# Patient Record
Sex: Male | Born: 1950 | Race: White | Hispanic: No | Marital: Married | Smoking: Former smoker
Health system: Southern US, Community
[De-identification: ages and names within clinical notes are randomized; demographics above are authoritative.]

## PROBLEM LIST (undated history)

## (undated) DIAGNOSIS — E119 Type 2 diabetes mellitus without complications: Secondary | ICD-10-CM

## (undated) DIAGNOSIS — I739 Peripheral vascular disease, unspecified: Secondary | ICD-10-CM

## (undated) DIAGNOSIS — I10 Essential (primary) hypertension: Secondary | ICD-10-CM

## (undated) HISTORY — DX: Essential (primary) hypertension: I10

## (undated) HISTORY — DX: Peripheral vascular disease, unspecified: I73.9

## (undated) HISTORY — DX: Type 2 diabetes mellitus without complications: E11.9

---

## 2010-08-21 ENCOUNTER — Other Ambulatory Visit: Payer: Self-pay | Admitting: Neurosurgery

## 2010-08-21 DIAGNOSIS — M4716 Other spondylosis with myelopathy, lumbar region: Secondary | ICD-10-CM

## 2010-08-22 ENCOUNTER — Ambulatory Visit
Admission: RE | Admit: 2010-08-22 | Discharge: 2010-08-22 | Disposition: A | Discharge status: Home / Self Care | Payer: PRIVATE HEALTH INSURANCE | Source: Ambulatory Visit | Attending: Neurosurgery | Admitting: Neurosurgery

## 2010-08-22 DIAGNOSIS — M4716 Other spondylosis with myelopathy, lumbar region: Secondary | ICD-10-CM

## 2010-09-18 ENCOUNTER — Encounter (INDEPENDENT_AMBULATORY_CARE_PROVIDER_SITE_OTHER): Payer: PRIVATE HEALTH INSURANCE

## 2010-09-18 DIAGNOSIS — I70219 Atherosclerosis of native arteries of extremities with intermittent claudication, unspecified extremity: Secondary | ICD-10-CM

## 2010-09-18 NOTE — Procedures (Unsigned)
LOWER EXTREMITY ARTERIAL DUPLEX  INDICATION:  Claudication of the left lower extremity.  HISTORY: Diabetes:  No. Cardiac:  No. Hypertension:  Yes. Smoking:  Currently. Previous Surgery:  No.  SINGLE LEVEL ARTERIAL EXAM                         RIGHT                LEFT Brachial:               141                  142 Anterior tibial:        150                  97 Posterior tibial:       145                  92 Peroneal: Ankle/Brachial Index:   106                  0.68  LOWER EXTREMITY ARTERIAL DUPLEX EXAM  DUPLEX:  Left distal common iliac/proximal external iliac stenosis of greater than 75% with a peak systolic velocity of 505 cm/s.  IMPRESSION: 1. 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 2. Remainder of the left lower extremity appears patent and without     any evidence of hemodynamically significant stenosis. 3. The right ankle brachial index is normal and the left ankle     brachial index is suggestive of moderate disease. 4. Preliminary results faxed to Dr. Trey Sailors on 09/18/2010 at 3:15PM.         ___________________________________________ Di Kindle. Edilia Bo, M.D.  SH/MEDQ  D:  09/18/2010  T:  09/18/2010  Job:  [id number]

## 2010-10-17 ENCOUNTER — Encounter (INDEPENDENT_AMBULATORY_CARE_PROVIDER_SITE_OTHER): Payer: PRIVATE HEALTH INSURANCE | Admitting: Vascular Surgery

## 2010-10-17 DIAGNOSIS — I70219 Atherosclerosis of native arteries of extremities with intermittent claudication, unspecified extremity: Secondary | ICD-10-CM

## 2010-10-17 NOTE — Consult Note (Signed)
NEW PATIENT CONSULTATION  Dennis, Bradley DOB:  06-16-1950                                       10/17/2010 [id number]#:[id number]  Patient presents today for evaluation of left leg claudication.  He is an active 60 year old gentleman who reports progressively severe discomfort in his left leg with walking.  He reports this begins in his buttocks and if he walks further, it can extend into his calf.  It is relieved with rest.  He does not have any rest pain.  This has been present for several months and has become increasingly severe over this time.  He does not have any history of tissue loss.  He reports this is an aching and cramping sensation.  His past history is significant for hypertension.  He has no history of cardiac disease.  No diabetes.  SOCIAL HISTORY:  He is married with 4 children.  He does smoke 1/2 pack of cigarettes per day.  He works as a Naval architect.  He does not drink alcohol.  FAMILY HISTORY:  Significant for premature atherosclerotic disease in his mother.  REVIEW OF SYSTEMS:  No weight loss or gain.  He weighs 255 pounds.  He is 5 feet 11 inches tall.  He does have pain in his feet with walking and lying flat. CARDIAC:  Shortness of breath with exertion. GI:  Black stools, blood in the stools.  Reflux, hiatal hernia, trouble swallowing, and constipation. GU:  Noted for urinary frequency. MUSCULOSKELETAL:  For joint and muscle pain. Review of systems otherwise negative.  PHYSICAL EXAMINATION:  A well-developed and well-nourished white male appearing stated age in no acute stress.  Blood pressure is 125/80, pulse 73, respirations 18.  HEENT:  Normal.  Chest:  Clear bilaterally without rales, rhonchi or wheezes.  His heart is a regular rate and rhythm.  Carotid arteries are normal pulse with no bruits.  He has 2+ radial and 2+ right femoral pulse. He has an absent left femoral pulse and absent distal pulses.  He does have 2+ dorsalis  pedis pulses on the right.  He does have scattered varicose veins over his lower extremities bilaterally.  Musculoskeletal:  No major deformities or cyanosis. Abdomen:  Soft, nontender.  No bruits noted in his lower quadrants. Neurologic:  No focal weakness or paresthesias.  Skin without ulcers or rashes.  He did undergo noninvasive vascular laboratory studies in our office on 09/18/10.  This showed a normal ankle arm index on the right with normal triphasic waveforms and diminished flow on the left foot with an ankle arm index of 0.68 and monophasic waveforms.  I discussed this at length with patient and his family present.  I have recommended arteriography for further evaluation. 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  This would be further evaluation with sonography, and it may be amenable to angioplasty, depending on the arteriograms.  He understands, and we will schedule this procedure for diagnostic and possible intervention as an outpatient at [facility] at his convenience.    Larina Earthly, M.D. Electronically Signed  TFE/MEDQ  D:  10/17/2010  T:  10/17/2010  Job:  [id number]  cc:   Payton Doughty, M.D. [facility]

## 2010-11-09 ENCOUNTER — Ambulatory Visit (HOSPITAL_COMMUNITY)
Admission: RE | Admit: 2010-11-09 | Discharge: 2010-11-09 | Disposition: A | Discharge status: Home / Self Care | Payer: PRIVATE HEALTH INSURANCE | Source: Ambulatory Visit | Attending: Vascular Surgery | Admitting: Vascular Surgery

## 2010-11-09 DIAGNOSIS — I10 Essential (primary) hypertension: Secondary | ICD-10-CM | POA: Insufficient documentation

## 2010-11-09 DIAGNOSIS — I70219 Atherosclerosis of native arteries of extremities with intermittent claudication, unspecified extremity: Secondary | ICD-10-CM | POA: Insufficient documentation

## 2010-11-09 DIAGNOSIS — I708 Atherosclerosis of other arteries: Secondary | ICD-10-CM | POA: Insufficient documentation

## 2010-11-09 HISTORY — PX: OTHER SURGICAL HISTORY: SHX169

## 2010-11-09 LAB — POCT I-STAT, CHEM 8
BUN: 13 mg/dL (ref 6–23)
Potassium: 4.2 mEq/L (ref 3.5–5.1)
Sodium: 138 mEq/L (ref 135–145)
TCO2: 26 mmol/L (ref 0–100)

## 2010-11-09 LAB — POCT ACTIVATED CLOTTING TIME
Activated Clotting Time: 270 seconds
Activated Clotting Time: 217 seconds
Activated Clotting Time: 181 seconds

## 2010-11-09 LAB — GLUCOSE, CAPILLARY: Glucose-Capillary: 227 mg/dL — ABNORMAL HIGH (ref 70–99)

## 2010-11-12 NOTE — Op Note (Signed)
Dennis Bradley, Dennis Bradley NO.:  [id number]  MEDICAL RECORD NO.:  [id number]  LOCATION:  [facility]                         FACILITY:  [facility]  PHYSICIAN:  Fransisco Hertz, MD       DATE OF BIRTH:  1951/01/13  DATE OF PROCEDURE:  11/09/2010 DATE OF DISCHARGE:  11/09/2010                              OPERATIVE REPORT   PROCEDURES: 1. Left common femoral artery cannulation under ultrasound guidance. 2. Aortogram. 3. Left common iliac artery stenting with a 10-mm x 25-mm Express. 4. Bilateral leg runoff.  PREOPERATIVE DIAGNOSIS:  Left iliac occlusive disease.  POSTOPERATIVE DIAGNOSIS:  Left iliac occlusive disease.  SURGEON:  Fransisco Hertz, MD  ANESTHESIA:  Conscious sedation.  ESTIMATED BLOOD LOSS:  Minimal.  CONTRAST:  150 mL.  SPECIMENS:  None.  FINDINGS IN THIS CASE: 1. Patent aorta. 2. Patent bilateral renal arteries. 3. Patent bilateral common iliac artery. 4. Left common iliac artery stenosis greater than 75%, resolved after     stenting. 5. Patent bilateral external iliac artery and external iliac artery. 6. Patent bilateral common femoral artery, profunda femoral arteries,     and superficial femoral arteries. 7. Patent bilateral popliteal and trifurcation. 8. Right posterior tibial, anterior tibial or runoffs to this leg. 9. The left runoff is not evident due to image acquisition.  The left     blood flow was significantly faster than the right lobe.  INDICATIONS:  This is a 60 year old gentleman who presents with left thigh claudication.  It was found on examination in clinic to have absent femoral pulse on the left side.  Subsequently, Dr. Arbie Cookey felt that the patient had a significant iliac occlusion and recommended proceeding forward with angiogram, possible intervention.  The patient is aware of the risks, benefits of this procedure.  He is aware of the risks include but are not limited to bleeding, infection, access site complication, possible  embolization, possible rupture of treated vessels, possible need for emergent procedures, and also possible need for additional surgical procedures.  He is aware of these risks and agreed to proceed forward.  DESCRIPTION OF OPERATION:  After full informed written consent was obtained from the patient, he was brought back to the angio suite, placed supine upon the angio table.  He was connected to monitoring equipment and given conscious sedation, amounts of which are documented in his chart.  He was then prepped and draped in standard fashion for aortogram of bilateral leg runoff.  I turned my attention first to his left groin.  Under ultrasound guidance, I identified the common femoral artery.  There was no extensive pulsation in this common femoral artery. It was cannulated with a micropuncture needle and a microwire passed up into it.  The micro-sheath was then placed over the wire after removing the needle.  A Bentson wire was then passed up into the iliac arterial system; however, it caught midway; however, there was enough wire in the vessel to exchange the sheath out for a short 5-French sheath.  At this point, I did a quick hand injection, this demonstrated a lip in the common iliac artery that was impeding progress of the wire.  After accounting for  this, I was able to eventually maneuver the wire past this lip in the artery and advanced the wire up into the aorta.  The Omniflush catheter was loaded over the wire up to the level of L1.  The wire was removed.  The catheter was connected to the power injector circuit after performing declotting and de-airing maneuver.  Power injector aortogram was completed, the findings of which were listed above and then did oblique shots of the pelvis.  This was delineated the stenosis in the left common iliac artery.  This was greater than 75% and obviously hemodynamically significant as I had difficulty getting the wire up into the aorta.  At  this point, we exchanged out the sheath for a 7-French long sheath over the wire and then completed dedicated hand injection to clearly delineate the position of this stenosis and also obtain measurements.  Based on findings, i felt a 10-mm x 25-mm balloon expandable stent was indicated.  We then placed this over the wire distal to this lesion and then pulled back the sheath to reveal the stent and then pulled back the stent to appropriate location.  This was then deployed at 10 atmospheres of pressure, this fully deployed the stent.  There was an obvious waist in the artery while deploying the stent, which was completely resolution after deployment.  I then deflated the balloon and removed it and then did hand injection, this demonstrated resolution of the previous high-grade left common iliac artery stenosis.  At this point, I replaced the Omniflush catheter up proximal to the bifurcation of the common iliac arteries.  The catheter was reconnected to the power injector circuit, then we set him up for bilateral automated leg runoff and then we completed the automated bilateral leg runoff findings of which are listed above.  At this point, the patient had nearly received 200 mL of contrast.  As we had found the  lesion responsible for this patient's symptomatology, I did not feel  completing bilateral foot films justified the risk of additional contrast.   At this point, I reconstituted the crook of this catheter of a Bentson wire  and then pulled out the catheter and wire.  The sheath was aspirated and flushed with heparinized saline and I pulled the sheath back into the external iliac artery.  Plan in this patient is to pull the catheter in the holding area.  He is also going to be receive 300 mg of Plavix.  COMPLICATIONS:  None.  CONDITION:  Stable.     Fransisco Hertz, MD     BLC/MEDQ  D:  11/09/2010  T:  11/10/2010  Job:  [id number]  Electronically Signed by Leonides Sake MD on  11/12/2010 12:07:56 PM

## 2010-12-05 ENCOUNTER — Ambulatory Visit (INDEPENDENT_AMBULATORY_CARE_PROVIDER_SITE_OTHER): Payer: PRIVATE HEALTH INSURANCE | Admitting: Vascular Surgery

## 2010-12-05 DIAGNOSIS — I70219 Atherosclerosis of native arteries of extremities with intermittent claudication, unspecified extremity: Secondary | ICD-10-CM

## 2010-12-05 NOTE — Assessment & Plan Note (Signed)
OFFICE VISIT  Dennis Bradley, Dennis Bradley DOB:  09-Aug-1950                                       12/05/2010 [id number]#:[id number]  Patient presents today for follow-up of his left iliac stenting in conjunction with aortogram with runoff by Dr. Leonides Sake on 11/09/10.  I reviewed his films with patient.  This did show high-grade stenosis in his common iliac artery with excellent result from angioplasty and stenting.  Fortunately, patient has had complete resolution of his left leg claudication.  His left groin puncture site is well-healed without any evidence of wound issues.  He does have a palpable femoral and 2+ dorsalis pedis pulses bilaterally.  He will continue with his walking program.  He will continue his Plavix treatment and will see [place] again in 3 months with repeat ankle-arm indices and scanning of his angioplasty site.  He will notify us should he develop any difficulty in the interim.    Larina Earthly, M.D. Electronically Signed  TFE/MEDQ  D:  12/05/2010  T:  12/05/2010  Job:  [id number]  cc:   Roma Kayser, PA

## 2011-03-02 ENCOUNTER — Other Ambulatory Visit: Payer: Self-pay | Admitting: Neurosurgery

## 2011-03-02 DIAGNOSIS — M47816 Spondylosis without myelopathy or radiculopathy, lumbar region: Secondary | ICD-10-CM

## 2011-03-07 ENCOUNTER — Other Ambulatory Visit (INDEPENDENT_AMBULATORY_CARE_PROVIDER_SITE_OTHER): Payer: PRIVATE HEALTH INSURANCE | Admitting: *Deleted

## 2011-03-07 ENCOUNTER — Ambulatory Visit (INDEPENDENT_AMBULATORY_CARE_PROVIDER_SITE_OTHER): Payer: PRIVATE HEALTH INSURANCE | Admitting: *Deleted

## 2011-03-07 DIAGNOSIS — Z48812 Encounter for surgical aftercare following surgery on the circulatory system: Secondary | ICD-10-CM

## 2011-03-07 DIAGNOSIS — I739 Peripheral vascular disease, unspecified: Secondary | ICD-10-CM

## 2011-03-13 NOTE — Procedures (Unsigned)
AORTA-ILIAC DUPLEX EVALUATION  INDICATION:  Follow up left CIA stent placed 11/09/10.  HISTORY: Diabetes:  No. Cardiac:  Yes. Hypertension:  Yes. Smoking:  Yes. Previous Surgery:  No.              SINGLE LEVEL ARTERIAL EXAM                             RIGHT                  LEFT Brachial: Anterior tibial: Posterior tibial: Peroneal: Ankle/brachial index: Previous ABI/date:  AORTA-ILIAC DUPLEX EXAM Aorta - Proximal     NV Aorta - Mid          75 cm/s Aorta - Distal       NV  RIGHT                                   LEFT                   CIA-PROXIMAL          NV                   CIA-DISTAL            NV                   HYPOGASTRIC           NV                   EIA-PROXIMAL          NV                   EIA-MID               120 cm/s                   EIA-DISTAL            163 cm/s  IMPRESSION: 1. The left common iliac artery stent could not be visualized due to     bowel gas; however, outflow waveforms are triphasic. 2. Of note, the examination was performed in the afternoon, and the     patient was not n.p.o.  ___________________________________________ Fransisco Hertz, MD  LT/MEDQ  D:  03/07/2011  T:  03/07/2011  Job:  [id number]

## 2011-03-14 ENCOUNTER — Encounter: Payer: Self-pay | Admitting: Vascular Surgery

## 2011-03-15 ENCOUNTER — Ambulatory Visit
Admission: RE | Admit: 2011-03-15 | Discharge: 2011-03-15 | Disposition: A | Discharge status: Home / Self Care | Payer: PRIVATE HEALTH INSURANCE | Source: Ambulatory Visit | Attending: Neurosurgery | Admitting: Neurosurgery

## 2011-03-15 ENCOUNTER — Other Ambulatory Visit: Payer: Self-pay | Admitting: Neurosurgery

## 2011-03-15 DIAGNOSIS — M47816 Spondylosis without myelopathy or radiculopathy, lumbar region: Secondary | ICD-10-CM

## 2011-03-15 MED ORDER — METHYLPREDNISOLONE ACETATE 40 MG/ML INJ SUSP (RADIOLOG
120.0000 mg | Freq: Once | INTRAMUSCULAR | Status: AC
  Administered 2011-03-15: 120 mg via INTRA_ARTICULAR

## 2011-03-15 MED ORDER — IOHEXOL 180 MG/ML  SOLN
1.0000 mL | Freq: Once | INTRAMUSCULAR | Status: AC | PRN
  Administered 2011-03-15: 1 mL via INTRA_ARTICULAR

## 2011-06-12 ENCOUNTER — Other Ambulatory Visit: Payer: PRIVATE HEALTH INSURANCE

## 2011-06-19 ENCOUNTER — Other Ambulatory Visit: Payer: PRIVATE HEALTH INSURANCE

## 2011-06-20 ENCOUNTER — Ambulatory Visit (INDEPENDENT_AMBULATORY_CARE_PROVIDER_SITE_OTHER): Payer: PRIVATE HEALTH INSURANCE | Admitting: *Deleted

## 2011-06-20 ENCOUNTER — Other Ambulatory Visit (INDEPENDENT_AMBULATORY_CARE_PROVIDER_SITE_OTHER): Payer: PRIVATE HEALTH INSURANCE | Admitting: *Deleted

## 2011-06-20 ENCOUNTER — Other Ambulatory Visit: Payer: PRIVATE HEALTH INSURANCE

## 2011-06-20 DIAGNOSIS — I739 Peripheral vascular disease, unspecified: Secondary | ICD-10-CM

## 2011-06-20 DIAGNOSIS — Z48812 Encounter for surgical aftercare following surgery on the circulatory system: Secondary | ICD-10-CM

## 2011-06-28 ENCOUNTER — Encounter: Payer: Self-pay | Admitting: Vascular Surgery

## 2011-06-29 NOTE — Procedures (Unsigned)
AORTA-ILIAC DUPLEX EVALUATION  INDICATION:  Followup left CIA stent.  HISTORY: Diabetes:  Yes Cardiac:  No Hypertension:  Yes Smoking:  Yes Previous Surgery:  Left CIA stent placed 11/09/2010              SINGLE LEVEL ARTERIAL EXAM                             RIGHT                  LEFT Brachial: Anterior tibial: Posterior tibial: Peroneal: Ankle/brachial index:      1.07                   1.21 Previous ABI/date:         03/07/2011, 1.02       03/07/2011, 1.21  AORTA-ILIAC DUPLEX EXAM Aorta - Proximal     106 cm/s Aorta - Mid          84 cm/s Aorta - Distal       77 cm/s  RIGHT                                   LEFT                   CIA-PROXIMAL          137 cm/s (stent)                   CIA-DISTAL            135 cm/s (stent)                   HYPOGASTRIC           NV                   EIA-PROXIMAL          186 cm/s                   EIA-MID               180 cm/s                   EIA-DISTAL            124 cm/s  IMPRESSION:  Widely patent left CIA stent without evidence of restenosis or hyperplasia.  ___________________________________________ Larina Earthly, M.D.  LT/MEDQ  D:  06/20/2011  T:  06/20/2011  Job:  [id number]

## 2011-09-12 ENCOUNTER — Encounter (INDEPENDENT_AMBULATORY_CARE_PROVIDER_SITE_OTHER): Payer: PRIVATE HEALTH INSURANCE | Admitting: *Deleted

## 2011-09-12 ENCOUNTER — Ambulatory Visit (INDEPENDENT_AMBULATORY_CARE_PROVIDER_SITE_OTHER): Payer: PRIVATE HEALTH INSURANCE | Admitting: *Deleted

## 2011-09-12 DIAGNOSIS — I724 Aneurysm of artery of lower extremity: Secondary | ICD-10-CM

## 2011-09-12 DIAGNOSIS — Z48812 Encounter for surgical aftercare following surgery on the circulatory system: Secondary | ICD-10-CM

## 2011-09-12 DIAGNOSIS — I739 Peripheral vascular disease, unspecified: Secondary | ICD-10-CM

## 2011-09-20 ENCOUNTER — Encounter: Payer: Self-pay | Admitting: Vascular Surgery

## 2011-09-20 ENCOUNTER — Other Ambulatory Visit: Payer: Self-pay | Admitting: *Deleted

## 2011-09-20 DIAGNOSIS — Z48812 Encounter for surgical aftercare following surgery on the circulatory system: Secondary | ICD-10-CM

## 2011-09-20 DIAGNOSIS — I739 Peripheral vascular disease, unspecified: Secondary | ICD-10-CM

## 2011-09-20 NOTE — Procedures (Unsigned)
AORTA-ILIAC DUPLEX EVALUATION  INDICATION:  Follow up left CIA stent.  HISTORY: Diabetes:  Yes. Cardiac:  No. Hypertension:  Yes. Smoking:  Yes. Previous Surgery:  Left CIA stent placed 11/09/10.              SINGLE LEVEL ARTERIAL EXAM                             RIGHT                  LEFT Brachial: Anterior tibial: Posterior tibial: Peroneal: Ankle/brachial index:      1.01                   1.15 Previous ABI/date:         06/19/10, 1.07         06/19/10, 1.21  AORTA-ILIAC DUPLEX EXAM Aorta - Proximal     67 cm/s Aorta - Mid          93 cm/s Aorta - Distal       32 cm/s  RIGHT                                   LEFT                   CIA-PROXIMAL          153 cm/s (stent)                   CIA-DISTAL            140 cm/s (stent)                   HYPOGASTRIC                   EIA-PROXIMAL          158 cm/s                   EIA-MID               179 cm/s                   EIA-DISTAL            160 cm/s  IMPRESSION: 1. Patent left common iliac artery stent with mild hyperplasia     observed without focal stenosis. 2. 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  The aortic diameters are within normal limits at     approximately 2.6 cm. 3. No significant change since the previous examination.  ___________________________________________ Larina Earthly, M.D.  LT/MEDQ  D:  09/13/2011  T:  09/13/2011  Job:  [id number]

## 2011-11-09 ENCOUNTER — Other Ambulatory Visit: Payer: Self-pay | Admitting: Vascular Surgery

## 2012-03-12 ENCOUNTER — Encounter: Payer: Self-pay | Admitting: Neurosurgery

## 2012-03-18 ENCOUNTER — Ambulatory Visit: Payer: PRIVATE HEALTH INSURANCE | Admitting: Neurosurgery

## 2012-03-18 ENCOUNTER — Other Ambulatory Visit: Payer: PRIVATE HEALTH INSURANCE

## 2012-03-27 ENCOUNTER — Ambulatory Visit: Payer: PRIVATE HEALTH INSURANCE | Admitting: Neurosurgery

## 2012-03-27 ENCOUNTER — Other Ambulatory Visit: Payer: PRIVATE HEALTH INSURANCE

## 2012-04-21 ENCOUNTER — Ambulatory Visit: Payer: PRIVATE HEALTH INSURANCE | Admitting: Neurosurgery

## 2012-04-21 ENCOUNTER — Other Ambulatory Visit: Payer: PRIVATE HEALTH INSURANCE

## 2012-05-29 ENCOUNTER — Encounter: Payer: Self-pay | Admitting: Neurosurgery

## 2012-05-30 ENCOUNTER — Other Ambulatory Visit (INDEPENDENT_AMBULATORY_CARE_PROVIDER_SITE_OTHER): Payer: PRIVATE HEALTH INSURANCE | Admitting: *Deleted

## 2012-05-30 ENCOUNTER — Ambulatory Visit (INDEPENDENT_AMBULATORY_CARE_PROVIDER_SITE_OTHER): Payer: PRIVATE HEALTH INSURANCE | Admitting: Neurosurgery

## 2012-05-30 ENCOUNTER — Encounter (INDEPENDENT_AMBULATORY_CARE_PROVIDER_SITE_OTHER): Payer: PRIVATE HEALTH INSURANCE | Admitting: *Deleted

## 2012-05-30 ENCOUNTER — Encounter: Payer: Self-pay | Admitting: Neurosurgery

## 2012-05-30 VITALS — BP 128/77 | HR 64 | Ht 71.0 in | Wt 234.7 lb

## 2012-05-30 DIAGNOSIS — Z48812 Encounter for surgical aftercare following surgery on the circulatory system: Secondary | ICD-10-CM

## 2012-05-30 DIAGNOSIS — I771 Stricture of artery: Secondary | ICD-10-CM | POA: Insufficient documentation

## 2012-05-30 DIAGNOSIS — I739 Peripheral vascular disease, unspecified: Secondary | ICD-10-CM

## 2012-05-30 NOTE — Addendum Note (Signed)
Addended by: Sharee Pimple on: 05/30/2012 02:58 PM   Modules accepted: Orders

## 2012-05-30 NOTE — Addendum Note (Signed)
Addended by: Sharee Pimple on: 05/30/2012 02:59 PM   Modules accepted: Orders

## 2012-05-30 NOTE — Progress Notes (Signed)
Subjective:     Patient ID: Dennis Bradley, male   DOB: 1951-02-19, 62 y.o.   MRN: [id number]  HPI: Surveillance status post left common iliac artery stent in June of 2012. The patient reports no claudication or rest pain. The patient has no other medical issues at this time.   Review of Systems: 12 point review of systems is notable for the difficulties described above otherwise unremarkable     Objective:   Physical Exam: Afebrile, vital signs are stable, the patient has palpable lower extremity pulses bilaterally and is well perfused.     Assessment:     Asymptomatic patient with no claudication no back pain or hip pain status post left CIA stent June 2012. 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    Plan:     The patient will followup in one year with repeat ABIs and iliac stent evaluation, his questions were encouraged and answered, he is in agreement with this plan.

## 2012-11-17 ENCOUNTER — Telehealth: Payer: Self-pay

## 2012-11-17 DIAGNOSIS — R200 Anesthesia of skin: Secondary | ICD-10-CM

## 2012-11-17 DIAGNOSIS — I739 Peripheral vascular disease, unspecified: Secondary | ICD-10-CM

## 2012-11-17 NOTE — Telephone Encounter (Signed)
Pt. called to report a 2 week hx. of tingling in left foot.  States that in the last week, has started having pain in left buttock, radiating down to shin, with walking, and getting in and out of truck.  Describes "an aching in his shin."  Also states has discomfort inner left thigh between his genitals and groin.  States his pain is worse today.  Denies any color or temperature change of left leg/foot.  Denies any open sores.

## 2012-11-17 NOTE — Telephone Encounter (Signed)
Spoke with Dr. Myra Gianotti.  Advised that pt. Should be scheduled for left lower art. Duplex and ABI's tomrrow.  Advised pt. To expect a call with an appt. For vascular studies.  Pt. Advised not to eat in the morning, and to only take morning meds with sips of water.  Verb. Understanding.

## 2012-11-19 ENCOUNTER — Encounter (INDEPENDENT_AMBULATORY_CARE_PROVIDER_SITE_OTHER): Payer: PRIVATE HEALTH INSURANCE | Admitting: *Deleted

## 2012-11-19 ENCOUNTER — Other Ambulatory Visit (INDEPENDENT_AMBULATORY_CARE_PROVIDER_SITE_OTHER): Payer: PRIVATE HEALTH INSURANCE | Admitting: *Deleted

## 2012-11-19 DIAGNOSIS — R200 Anesthesia of skin: Secondary | ICD-10-CM

## 2012-11-19 DIAGNOSIS — I739 Peripheral vascular disease, unspecified: Secondary | ICD-10-CM

## 2012-11-19 DIAGNOSIS — Z48811 Encounter for surgical aftercare following surgery on the nervous system: Secondary | ICD-10-CM

## 2012-11-20 ENCOUNTER — Telehealth: Payer: Self-pay | Admitting: Vascular Surgery

## 2012-11-20 NOTE — Telephone Encounter (Addendum)
Message copied by Fredrich Birks on Thu Nov 20, 2012 10:11 AM ------      Message from: Phillips Odor      Created: Thu Nov 20, 2012 10:05 AM      Regarding: needs appt. changed from [facility] to [facility] 05/2013       Pt. Had vascular studies done on 6/25; TFE reviewed.  Next appt. In 05/2013; needs to keep vasc studies and have provider appt. With Dr. Arbie Cookey @ that time.  ------  11/20/12: changed from [facility] to TFE, dpm

## 2012-11-21 ENCOUNTER — Encounter: Payer: Self-pay | Admitting: Vascular Surgery

## 2012-11-21 ENCOUNTER — Other Ambulatory Visit: Payer: Self-pay

## 2012-11-21 DIAGNOSIS — M79609 Pain in unspecified limb: Secondary | ICD-10-CM

## 2012-11-21 DIAGNOSIS — I739 Peripheral vascular disease, unspecified: Secondary | ICD-10-CM

## 2012-12-07 ENCOUNTER — Other Ambulatory Visit: Payer: Self-pay | Admitting: Neurosurgery

## 2012-12-07 DIAGNOSIS — M4716 Other spondylosis with myelopathy, lumbar region: Secondary | ICD-10-CM

## 2012-12-31 ENCOUNTER — Ambulatory Visit
Admission: RE | Admit: 2012-12-31 | Discharge: 2012-12-31 | Disposition: A | Discharge status: Home / Self Care | Payer: 59 | Source: Ambulatory Visit | Attending: Neurosurgery | Admitting: Neurosurgery

## 2012-12-31 VITALS — BP 146/73 | HR 67

## 2012-12-31 DIAGNOSIS — M4716 Other spondylosis with myelopathy, lumbar region: Secondary | ICD-10-CM

## 2012-12-31 MED ORDER — IOHEXOL 180 MG/ML  SOLN
1.0000 mL | Freq: Once | INTRAMUSCULAR | Status: AC | PRN
  Administered 2012-12-31: 1 mL via EPIDURAL

## 2012-12-31 MED ORDER — METHYLPREDNISOLONE ACETATE 40 MG/ML INJ SUSP (RADIOLOG
120.0000 mg | Freq: Once | INTRAMUSCULAR | Status: AC
  Administered 2012-12-31: 120 mg via EPIDURAL

## 2013-06-01 ENCOUNTER — Encounter: Payer: Self-pay | Admitting: Vascular Surgery

## 2013-06-02 ENCOUNTER — Ambulatory Visit (INDEPENDENT_AMBULATORY_CARE_PROVIDER_SITE_OTHER)
Admission: RE | Admit: 2013-06-02 | Discharge: 2013-06-02 | Disposition: A | Discharge status: Home / Self Care | Payer: 59 | Source: Ambulatory Visit | Attending: Neurosurgery | Admitting: Neurosurgery

## 2013-06-02 ENCOUNTER — Encounter: Payer: Self-pay | Admitting: Vascular Surgery

## 2013-06-02 ENCOUNTER — Ambulatory Visit (HOSPITAL_COMMUNITY)
Admission: RE | Admit: 2013-06-02 | Discharge: 2013-06-02 | Disposition: A | Discharge status: Home / Self Care | Payer: 59 | Source: Ambulatory Visit | Attending: Vascular Surgery | Admitting: Vascular Surgery

## 2013-06-02 ENCOUNTER — Ambulatory Visit (INDEPENDENT_AMBULATORY_CARE_PROVIDER_SITE_OTHER): Payer: 59 | Admitting: Vascular Surgery

## 2013-06-02 ENCOUNTER — Other Ambulatory Visit: Payer: PRIVATE HEALTH INSURANCE

## 2013-06-02 ENCOUNTER — Ambulatory Visit: Payer: PRIVATE HEALTH INSURANCE | Admitting: Neurosurgery

## 2013-06-02 VITALS — BP 117/68 | HR 61 | Ht 71.0 in | Wt 235.9 lb

## 2013-06-02 DIAGNOSIS — I739 Peripheral vascular disease, unspecified: Secondary | ICD-10-CM | POA: Insufficient documentation

## 2013-06-02 DIAGNOSIS — I708 Atherosclerosis of other arteries: Secondary | ICD-10-CM | POA: Insufficient documentation

## 2013-06-02 DIAGNOSIS — Z48812 Encounter for surgical aftercare following surgery on the circulatory system: Secondary | ICD-10-CM

## 2013-06-02 DIAGNOSIS — I771 Stricture of artery: Secondary | ICD-10-CM

## 2013-06-02 NOTE — Progress Notes (Signed)
The patient presents today for followup of his left common iliac artery stenting in June of 2012. He has no new major medical difficulties. He has no lower extremity claudication symptoms. 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 This resolves after he walks for a brief. I explained that this is not related to arterial insufficiency and sounds more like nerve impingement. This is quite mild and not limiting to him. He has no lower extremity tissue loss. He has no cardiac difficulties  Past Medical History  Diagnosis Date  . Peripheral vascular disease   . Hypertension     History  Substance Use Topics  . Smoking status: Former Smoker -- 1.50 packs/day for 50 years    Types: Cigarettes    Quit date: 11/09/2010  . Smokeless tobacco: Never Used     Comment: pt refused smoking info  . Alcohol Use: 3.0 oz/week    5 Cans of beer per week    Family History  Problem Relation Age of Onset  . Diabetes Mother   . Hypertension Mother   . Heart attack Mother     No Known Allergies  Current outpatient prescriptions:clopidogrel (PLAVIX) 75 MG tablet, TAKE ONE TABLET BY MOUTH EVERY DAY, Disp: 30 tablet, Rfl: 11;  lisinopril-hydrochlorothiazide (PRINZIDE,ZESTORETIC) 20-12.5 MG per tablet, Take 1 tablet by mouth daily., Disp: , Rfl: ;  metFORMIN (GLUCOPHAGE) 500 MG tablet, Take 500 mg by mouth. 2 tablets in morning, 2 tablets at lunch, Disp: , Rfl: ;  Omeprazole (PRILOSEC PO), Take by mouth., Disp: , Rfl:   BP 117/68  Pulse 61  Ht 5\' 11"  (1.803 m)  Wt 235 lb 14.4 oz (107.004 kg)  BMI 32.92 kg/m2  SpO2 100%  Body mass index is 32.92 kg/(m^2).       Physical exam well-developed well-nourished gentleman in no acute distress Carotid arteries without bruits bilaterally Heart regular rate and rhythm Chest clear with equal breath sounds Pulse status 2+ radial 2+ femoral 2+ popliteal and 2+  dorsalis pedis pulses bilaterally Skin without ulcers or rashes  Noninvasive vascular laboratory studies today reveal normal ankle arm index bilaterally and normal waveforms throughout his left iliac system. He does have some mild dilatation in his left proximal common iliac artery and this is stable at 1.7 cm  Impression and plan: Table after stent graft left common iliac artery. Continue usual activity. We'll see him again in one year with repeat vascular lab study

## 2013-06-02 NOTE — Addendum Note (Signed)
Addended by: Sharee PimpleMCCHESNEY, MARILYN K on: 06/02/2013 12:59 PM   Modules accepted: Orders

## 2013-06-26 ENCOUNTER — Other Ambulatory Visit: Payer: Self-pay | Admitting: Neurosurgery

## 2013-06-26 DIAGNOSIS — M47816 Spondylosis without myelopathy or radiculopathy, lumbar region: Secondary | ICD-10-CM

## 2013-07-02 ENCOUNTER — Ambulatory Visit
Admission: RE | Admit: 2013-07-02 | Discharge: 2013-07-02 | Disposition: A | Discharge status: Home / Self Care | Payer: 59 | Source: Ambulatory Visit | Attending: Neurosurgery | Admitting: Neurosurgery

## 2013-07-02 VITALS — BP 113/70 | HR 77

## 2013-07-02 DIAGNOSIS — M47816 Spondylosis without myelopathy or radiculopathy, lumbar region: Secondary | ICD-10-CM

## 2013-07-02 DIAGNOSIS — M5126 Other intervertebral disc displacement, lumbar region: Secondary | ICD-10-CM

## 2013-07-02 MED ORDER — METHYLPREDNISOLONE ACETATE 40 MG/ML INJ SUSP (RADIOLOG
120.0000 mg | Freq: Once | INTRAMUSCULAR | Status: AC
  Administered 2013-07-02: 120 mg via EPIDURAL

## 2013-07-02 MED ORDER — IOHEXOL 180 MG/ML  SOLN
1.0000 mL | Freq: Once | INTRAMUSCULAR | Status: AC | PRN
  Administered 2013-07-02: 1 mL via EPIDURAL

## 2013-11-16 ENCOUNTER — Telehealth: Payer: Self-pay

## 2013-11-16 DIAGNOSIS — I739 Peripheral vascular disease, unspecified: Secondary | ICD-10-CM

## 2013-11-16 DIAGNOSIS — Z95828 Presence of other vascular implants and grafts: Secondary | ICD-10-CM

## 2013-11-16 DIAGNOSIS — R202 Paresthesia of skin: Secondary | ICD-10-CM

## 2013-11-16 NOTE — Telephone Encounter (Signed)
Pt. called to report 1 wk. hx of numbness in toes of left foot.  Stated the bottom of his foot "feels hot/ burns."   Stated "my left leg feels funny when I raise my leg up, but most of the numbness is in my toes."  Reported he had an episode of pain in the left buttock this morning, but it has subsided.  Advised to come in at 9:45 AM, 6/23 for vascular studies, and office exam by nurse practitioner.  Agrees w/ plan.

## 2013-11-17 ENCOUNTER — Ambulatory Visit (HOSPITAL_COMMUNITY)
Admission: RE | Admit: 2013-11-17 | Discharge: 2013-11-17 | Disposition: A | Discharge status: Home / Self Care | Payer: 59 | Source: Ambulatory Visit | Attending: Family | Admitting: Family

## 2013-11-17 ENCOUNTER — Encounter: Payer: Self-pay | Admitting: Family

## 2013-11-17 ENCOUNTER — Ambulatory Visit (INDEPENDENT_AMBULATORY_CARE_PROVIDER_SITE_OTHER)
Admission: RE | Admit: 2013-11-17 | Discharge: 2013-11-17 | Disposition: A | Discharge status: Home / Self Care | Payer: 59 | Source: Ambulatory Visit | Attending: Family | Admitting: Family

## 2013-11-17 ENCOUNTER — Ambulatory Visit (INDEPENDENT_AMBULATORY_CARE_PROVIDER_SITE_OTHER): Payer: 59 | Admitting: Family

## 2013-11-17 VITALS — BP 99/66 | HR 62 | Resp 16 | Ht 71.0 in | Wt 228.0 lb

## 2013-11-17 DIAGNOSIS — R209 Unspecified disturbances of skin sensation: Secondary | ICD-10-CM

## 2013-11-17 DIAGNOSIS — I739 Peripheral vascular disease, unspecified: Secondary | ICD-10-CM

## 2013-11-17 DIAGNOSIS — R202 Paresthesia of skin: Secondary | ICD-10-CM | POA: Insufficient documentation

## 2013-11-17 DIAGNOSIS — Z9889 Other specified postprocedural states: Secondary | ICD-10-CM

## 2013-11-17 DIAGNOSIS — Z48812 Encounter for surgical aftercare following surgery on the circulatory system: Secondary | ICD-10-CM | POA: Insufficient documentation

## 2013-11-17 DIAGNOSIS — Z95828 Presence of other vascular implants and grafts: Secondary | ICD-10-CM

## 2013-11-17 DIAGNOSIS — I771 Stricture of artery: Secondary | ICD-10-CM

## 2013-11-17 NOTE — Progress Notes (Signed)
[facility]  HISTORY AND PHYSICAL -PAD  History of Present Illness Dennis Bradley is a 63 y.o. male patient of Dr. Arbie Cookey who is s/p left common iliac artery stenting in June of 2012. He returns today with c/o left leg feels funny, numbness in left toes, for a week, left buttocks pain. Left and toes numbness occurs when he is sitting, denies these symptoms with walking, denies any claudication symptoms with walking He drives a truck for a living. He denies non healing wounds. He sees Dr. Channing Mutters for known lumbar disc issues, has been having ESI's which helps for about 6 months. He denies any history of stroke or TIA.  The patient denies New Medical or Surgical History.  Pt Diabetic: Yes, was told his sugar is OK Pt smoker: smoker  (1 ppd, started at age 48 yrs)  Pt meds include: Statin :No ASA: No Other anticoagulants/antiplatelets: Plavix  Past Medical History  Diagnosis Date  . Peripheral vascular disease   . Hypertension     Social History History  Substance Use Topics  . Smoking status: Former Smoker -- 1.50 packs/day for 50 years    Types: Cigarettes    Quit date: 11/09/2010  . Smokeless tobacco: Never Used     Comment: pt refused smoking info  . Alcohol Use: 3.0 oz/week    5 Cans of beer per week    Family History Family History  Problem Relation Age of Onset  . Diabetes Mother   . Hypertension Mother   . Heart attack Mother   . Alzheimer's disease Mother     Past Surgical History  Procedure Laterality Date  . Iliac artery stenting  11/09/2010    No Known Allergies  Current Outpatient Prescriptions  Medication Sig Dispense Refill  . clopidogrel (PLAVIX) 75 MG tablet TAKE ONE TABLET BY MOUTH EVERY DAY  30 tablet  11  . lisinopril-hydrochlorothiazide (PRINZIDE,ZESTORETIC) 20-12.5 MG per tablet Take 1 tablet by mouth daily.      . metFORMIN (GLUCOPHAGE) 500 MG tablet Take 500 mg by mouth. 2 tablets in morning, 2 tablets at lunch       . Omeprazole (PRILOSEC PO) Take by mouth.       No current facility-administered medications for this visit.    ROS: See HPI for pertinent positives and negatives.   Physical Examination  Filed Vitals:   11/17/13 1232  BP: 99/66  Pulse: 62  Resp: 16    General: A&O x 3, WDWN. Gait: normal Eyes: PERRLA. Pulmonary: CTAB, without wheezes , rales or rhonchi. Cardiac: regular Rythm , without detected murmur.         Carotid Bruits Left Right   Negative Negative  Aorta is not palpable. Radial pulses: are 3+ right, 2+ left and palpable                           VASCULAR EXAM: Extremities without ischemic changes  without Gangrene; without open wounds.  LE Pulses LEFT RIGHT       FEMORAL  1+ palpable  not palpable        POPLITEAL  not palpable   not palpable       POSTERIOR TIBIAL  1+ palpable   1+ palpable        DORSALIS PEDIS      ANTERIOR TIBIAL not palpable  2+ palpable    Abdomen: soft, NT, no masses. Skin: no rashes, no ulcers noted. Musculoskeletal: no muscle wasting or atrophy.  Neurologic: A&O X 3; Appropriate Affect ; SENSATION: normal; MOTOR FUNCTION:  moving all extremities equally, motor strength 5/5 throughout. Speech is fluent/normal. CN 2-12 intact.    Non-Invasive Vascular Imaging: DATE: 11/17/2013 ILIAC ARTERY STENT EVALUATION    INDICATION: Peripheral Vascular Disease , Paresthesia    PREVIOUS INTERVENTION(S): Left iliac artery Percutaneous transluminal Angioplasty with stent 11/09/2010.    DUPLEX EXAM:     RIGHT  LEFT   Peak Systolic Velocity (cm/s) Ratio (if abnormal) Waveform  Peak Systolic Velocity (cm/s) Ratio (if abnormal) Waveform  34   Aorta - Distal 34  T  150   Artery - Proximal to Stent 142  T     Stent - Proximal 126  T     Stent - Mid 122  T     Stent - Distal 114  T     Artery - Distal to Stent 174  T  0.95/0.93  Today's ABI / TBI 1.12/0.71  1.18/0.77 Previous ABI / TBI (06/02/2013  ) 1.22/0.65    Waveform:    M - Monophasic       B - Biphasic       T - Triphasic  If Ankle Brachial Index (ABI) or Toe Brachial Index (TBI) performed, please see complete report     ADDITIONAL FINDINGS:     IMPRESSION: Patent abdominal aorta, no hemodynamically significant plaque present. Left common iliac artery stent is patent, no hemodynamically significant plaque present. Mild dilatation of 1.3cm present at the left common iliac artery origin.    Compared to the previous exam:  Stable ankle and toe brachial indices with patent left iliac artery stent since previous study on 06/02/2013.    ASSESSMENT: Dennis Bradley is a 63 y.o. male  who is s/p left common iliac artery stenting in June of 2012. He returns today with c/o left leg feels funny, numbness in left toes, for a week, left buttocks pain. ABI's are stable and normal, left common iliac stent is patent. He has no claudication symptoms with walking and has a known history of lumbar spine issues.  Unfortunately he continues to smoke, his DM seems in control.  He is already scheduled to return in January, 2016 for non invasive vascular lab studies and see Dr. Arbie CookeyEarly or me.  PLAN:  He was counseled re smoking cessation.  I discussed in depth with the patient the nature of atherosclerosis, and emphasized the importance of maximal medical management including strict control of blood pressure, blood glucose, and lipid levels, obtaining regular exercise, and cessation of smoking.  The patient is aware that without maximal medical management the underlying atherosclerotic disease process will progress, limiting the benefit of any interventions.  He is already scheduled to return in January, 2016 for non invasive vascular lab studies and see Dr. Arbie CookeyEarly or me.  The patient was given information about PAD including signs, symptoms, treatment, what symptoms should  prompt the patient to seek immediate medical care, and risk reduction measures to  take.  Dennis MarchSuzanne Juliene Kirsh, RN, MSN, FNP-C [facility] Office Phone: [phone]  Clinic MD: Early  11/17/2013 12:44 PM

## 2013-11-17 NOTE — Patient Instructions (Signed)
Peripheral Vascular Disease Peripheral Vascular Disease (PVD), also called Peripheral Arterial Disease (PAD), is a circulation problem caused by cholesterol (atherosclerotic plaque) deposits in the arteries. PVD commonly occurs in the lower extremities (legs) but it can occur in other areas of the body, such as your arms. The cholesterol buildup in the arteries reduces blood flow which can cause pain and other serious problems. The presence of PVD can place a person at risk for Coronary Artery Disease (CAD).  CAUSES  Causes of PVD can be many. It is usually associated with more than one risk factor such as:   High Cholesterol.  Smoking.  Diabetes.  Lack of exercise or inactivity.  High blood pressure (hypertension).  Obesity.  Family history. SYMPTOMS   When the lower extremities are affected, patients with PVD may experience:  Leg pain with exertion or physical activity. This is called INTERMITTENT CLAUDICATION. This may present as cramping or numbness with physical activity. The location of the pain is associated with the level of blockage. For example, blockage at the abdominal level (distal abdominal aorta) may result in buttock or hip pain. Lower leg arterial blockage may result in calf pain.  As PVD becomes more severe, pain can develop with less physical activity.  In people with severe PVD, leg pain may occur at rest.  Other PVD signs and symptoms:  Leg numbness or weakness.  Coldness in the affected leg or foot, especially when compared to the other leg.  A change in leg color.  Patients with significant PVD are more prone to ulcers or sores on toes, feet or legs. These may take longer to heal or may reoccur. The ulcers or sores can become infected.  If signs and symptoms of PVD are ignored, gangrene may occur. This can result in the loss of toes or loss of an entire limb.  Not all leg pain is related to PVD. Other medical conditions can cause leg pain such  as:  Blood clots (embolism) or Deep Vein Thrombosis.  Inflammation of the blood vessels (vasculitis).  Spinal stenosis. DIAGNOSIS  Diagnosis of PVD can involve several different types of tests. These can include:  Pulse Volume Recording Method (PVR). This test is simple, painless and does not involve the use of X-rays. PVR involves measuring and comparing the blood pressure in the arms and legs. An ABI (Ankle-Brachial Index) is calculated. The normal ratio of blood pressures is 1. As this number becomes smaller, it indicates more severe disease.  < 0.95 - indicates significant narrowing in one or more leg vessels.  <0.8 - there will usually be pain in the foot, leg or buttock with exercise.  <0.4 - will usually have pain in the legs at rest.  <0.25 - usually indicates limb threatening PVD.  Doppler detection of pulses in the legs. This test is painless and checks to see if you have a pulses in your legs/feet.  A dye or contrast material (a substance that highlights the blood vessels so they show up on x-ray) may be given to help your caregiver better see the arteries for the following tests. The dye is eliminated from your body by the kidney's. Your caregiver may order blood work to check your kidney function and other laboratory values before the following tests are performed:  Magnetic Resonance Angiography (MRA). An MRA is a picture study of the blood vessels and arteries. The MRA machine uses a large magnet to produce images of the blood vessels.  Computed Tomography Angiography (CTA). A CTA   is a specialized x-ray that looks at how the blood flows in your blood vessels. An IV may be inserted into your arm so contrast dye can be injected.  Angiogram. Is a procedure that uses x-rays to look at your blood vessels. This procedure is minimally invasive, meaning a small incision (cut) is made in your groin. A small tube (catheter) is then inserted into the artery of your groin. The catheter  is guided to the blood vessel or artery your caregiver wants to examine. Contrast dye is injected into the catheter. X-rays are then taken of the blood vessel or artery. After the images are obtained, the catheter is taken out. TREATMENT  Treatment of PVD involves many interventions which may include:  Lifestyle changes:  Quitting smoking.  Exercise.  Following a low fat, low cholesterol diet.  Control of diabetes.  Foot care is very important to the PVD patient. Good foot care can help prevent infection.  Medication:  Cholesterol-lowering medicine.  Blood pressure medicine.  Anti-platelet drugs.  Certain medicines may reduce symptoms of Intermittent Claudication.  Interventional/Surgical options:  Angioplasty. An Angioplasty is a procedure that inflates a balloon in the blocked artery. This opens the blocked artery to improve blood flow.  Stent Implant. A wire mesh tube (stent) is placed in the artery. The stent expands and stays in place, allowing the artery to remain open.  Peripheral Bypass Surgery. This is a surgical procedure that reroutes the blood around a blocked artery to help improve blood flow. This type of procedure may be performed if Angioplasty or stent implants are not an option. SEEK IMMEDIATE MEDICAL CARE IF:   You develop pain or numbness in your arms or legs.  Your arm or leg turns cold, becomes blue in color.  You develop redness, warmth, swelling and pain in your arms or legs. MAKE SURE YOU:   Understand these instructions.  Will watch your condition.  Will get help right away if you are not doing well or get worse. Document Released: 06/21/2004 Document Revised: 08/06/2011 Document Reviewed: 05/18/2008 ExitCare Patient Information 2015 [employer]. This information is not intended to replace advice given to you by your health care Skylinn Vialpando. Make sure you discuss any questions you have with your health care Casaundra Takacs.  Smoking  Cessation Quitting smoking is important to your health and has many advantages. However, it is not always easy to quit since nicotine is a very addictive drug. Often times, people try 3 times or more before being able to quit. This document explains the best ways for you to prepare to quit smoking. Quitting takes hard work and a lot of effort, but you can do it. ADVANTAGES OF QUITTING SMOKING  You will live longer, feel better, and live better.  Your body will feel the impact of quitting smoking almost immediately.  Within 20 minutes, blood pressure decreases. Your pulse returns to its normal level.  After 8 hours, carbon monoxide levels in the blood return to normal. Your oxygen level increases.  After 24 hours, the chance of having a heart attack starts to decrease. Your breath, hair, and body stop smelling like smoke.  After 48 hours, damaged nerve endings begin to recover. Your sense of taste and smell improve.  After 72 hours, the body is virtually free of nicotine. Your bronchial tubes relax and breathing becomes easier.  After 2 to 12 weeks, lungs can hold more air. Exercise becomes easier and circulation improves.  The risk of having a heart attack, stroke, cancer,   or lung disease is greatly reduced.  After 1 year, the risk of coronary heart disease is cut in half.  After 5 years, the risk of stroke falls to the same as a nonsmoker.  After 10 years, the risk of lung cancer is cut in half and the risk of other cancers decreases significantly.  After 15 years, the risk of coronary heart disease drops, usually to the level of a nonsmoker.  If you are pregnant, quitting smoking will improve your chances of having a healthy baby.  The people you live with, especially any children, will be healthier.  You will have extra money to spend on things other than cigarettes. QUESTIONS TO THINK ABOUT BEFORE ATTEMPTING TO QUIT You may want to talk about your answers with your  caregiver.  Why do you want to quit?  If you tried to quit in the past, what helped and what did not?  What will be the most difficult situations for you after you quit? How will you plan to handle them?  Who can help you through the tough times? Your family? Friends? A caregiver?  What pleasures do you get from smoking? What ways can you still get pleasure if you quit? Here are some questions to ask your caregiver:  How can you help me to be successful at quitting?  What medicine do you think would be best for me and how should I take it?  What should I do if I need more help?  What is smoking withdrawal like? How can I get information on withdrawal? GET READY  Set a quit date.  Change your environment by getting rid of all cigarettes, ashtrays, matches, and lighters in your home, car, or work. Do not let people smoke in your home.  Review your past attempts to quit. Think about what worked and what did not. GET SUPPORT AND ENCOURAGEMENT You have a better chance of being successful if you have help. You can get support in many ways.  Tell your family, friends, and co-workers that you are going to quit and need their support. Ask them not to smoke around you.  Get individual, group, or telephone counseling and support. Programs are available at local hospitals and health centers. Call your local health department for information about programs in your area.  Spiritual beliefs and practices may help some smokers quit.  Download a "quit meter" on your computer to keep track of quit statistics, such as how long you have gone without smoking, cigarettes not smoked, and money saved.  Get a self-help book about quitting smoking and staying off of tobacco. LEARN NEW SKILLS AND BEHAVIORS  Distract yourself from urges to smoke. Talk to someone, go for a walk, or occupy your time with a task.  Change your normal routine. Take a different route to work. Drink tea instead of coffee.  Eat breakfast in a different place.  Reduce your stress. Take a hot bath, exercise, or read a book.  Plan something enjoyable to do every day. Reward yourself for not smoking.  Explore interactive web-based programs that specialize in helping you quit. GET MEDICINE AND USE IT CORRECTLY Medicines can help you stop smoking and decrease the urge to smoke. Combining medicine with the above behavioral methods and support can greatly increase your chances of successfully quitting smoking.  Nicotine replacement therapy helps deliver nicotine to your body without the negative effects and risks of smoking. Nicotine replacement therapy includes nicotine gum, lozenges, inhalers, nasal sprays, and skin patches.   Some may be available over-the-counter and others require a prescription.  Antidepressant medicine helps people abstain from smoking, but how this works is unknown. This medicine is available by prescription.  Nicotinic receptor partial agonist medicine simulates the effect of nicotine in your brain. This medicine is available by prescription. Ask your caregiver for advice about which medicines to use and how to use them based on your health history. Your caregiver will tell you what side effects to look out for if you choose to be on a medicine or therapy. Carefully read the information on the package. Do not use any other product containing nicotine while using a nicotine replacement product.  RELAPSE OR DIFFICULT SITUATIONS Most relapses occur within the first 3 months after quitting. Do not be discouraged if you start smoking again. Remember, most people try several times before finally quitting. You may have symptoms of withdrawal because your body is used to nicotine. You may crave cigarettes, be irritable, feel very hungry, cough often, get headaches, or have difficulty concentrating. The withdrawal symptoms are only temporary. They are strongest when you first quit, but they will go away within  10-14 days. To reduce the chances of relapse, try to:  Avoid drinking alcohol. Drinking lowers your chances of successfully quitting.  Reduce the amount of caffeine you consume. Once you quit smoking, the amount of caffeine in your body increases and can give you symptoms, such as a rapid heartbeat, sweating, and anxiety.  Avoid smokers because they can make you want to smoke.  Do not let weight gain distract you. Many smokers will gain weight when they quit, usually less than 10 pounds. Eat a healthy diet and stay active. You can always lose the weight gained after you quit.  Find ways to improve your mood other than smoking. FOR MORE INFORMATION  www.smokefree.gov  Document Released: 05/08/2001 Document Revised: 11/13/2011 Document Reviewed: 08/23/2011 [employer] Patient Information 2015 [employer]. This information is not intended to replace advice given to you by your health care Humzah Harty. Make sure you discuss any questions you have with your health care Earsie Humm.  

## 2014-05-14 ENCOUNTER — Other Ambulatory Visit: Payer: Self-pay | Admitting: *Deleted

## 2014-05-14 DIAGNOSIS — I739 Peripheral vascular disease, unspecified: Secondary | ICD-10-CM

## 2014-05-14 DIAGNOSIS — Z9862 Peripheral vascular angioplasty status: Secondary | ICD-10-CM

## 2014-06-07 ENCOUNTER — Encounter: Payer: Self-pay | Admitting: Family

## 2014-06-08 ENCOUNTER — Ambulatory Visit: Payer: 59 | Admitting: Family

## 2014-06-08 ENCOUNTER — Inpatient Hospital Stay (HOSPITAL_COMMUNITY)
Admission: RE | Admit: 2014-06-08 | Discharge: 2014-06-08 | Disposition: A | Discharge status: Home / Self Care | Payer: 59 | Source: Ambulatory Visit | Attending: Vascular Surgery | Admitting: Vascular Surgery

## 2014-06-08 ENCOUNTER — Encounter (HOSPITAL_COMMUNITY): Payer: 59

## 2014-06-08 DIAGNOSIS — I739 Peripheral vascular disease, unspecified: Secondary | ICD-10-CM

## 2014-07-09 ENCOUNTER — Encounter: Payer: Self-pay | Admitting: Family

## 2014-07-13 ENCOUNTER — Ambulatory Visit (INDEPENDENT_AMBULATORY_CARE_PROVIDER_SITE_OTHER): Payer: 59 | Admitting: Family

## 2014-07-13 ENCOUNTER — Ambulatory Visit (INDEPENDENT_AMBULATORY_CARE_PROVIDER_SITE_OTHER)
Admission: RE | Admit: 2014-07-13 | Discharge: 2014-07-13 | Disposition: A | Discharge status: Home / Self Care | Payer: 59 | Source: Ambulatory Visit | Attending: Vascular Surgery | Admitting: Vascular Surgery

## 2014-07-13 ENCOUNTER — Encounter: Payer: Self-pay | Admitting: Family

## 2014-07-13 ENCOUNTER — Ambulatory Visit (HOSPITAL_COMMUNITY)
Admission: RE | Admit: 2014-07-13 | Discharge: 2014-07-13 | Disposition: A | Discharge status: Home / Self Care | Payer: 59 | Source: Ambulatory Visit | Attending: Family | Admitting: Family

## 2014-07-13 VITALS — BP 113/73 | HR 69 | Resp 16 | Ht 71.0 in | Wt 240.0 lb

## 2014-07-13 DIAGNOSIS — Z87891 Personal history of nicotine dependence: Secondary | ICD-10-CM | POA: Diagnosis not present

## 2014-07-13 DIAGNOSIS — Z9889 Other specified postprocedural states: Secondary | ICD-10-CM | POA: Insufficient documentation

## 2014-07-13 DIAGNOSIS — Z95828 Presence of other vascular implants and grafts: Secondary | ICD-10-CM

## 2014-07-13 DIAGNOSIS — I739 Peripheral vascular disease, unspecified: Secondary | ICD-10-CM

## 2014-07-13 DIAGNOSIS — Z9862 Peripheral vascular angioplasty status: Secondary | ICD-10-CM

## 2014-07-13 NOTE — Progress Notes (Signed)
[facility] [place] HISTORY AND PHYSICAL -PAD  History of Present Illness Stepehn Eckard is a 64 y.o. male  patient of Dr. Arbie Cookey who is s/p left common iliac artery stenting in June of 2012. He returns today for follow up. He drives a truck for a living, denies claudication symptoms with walking. He denies non healing wounds. He sees Dr. Channing Mutters for known lumbar disc issues, has been having ESI's which helps for about 6 months. He denies any history of stroke or TIA.  The patient denies New Medical or Surgical History.  Pt Diabetic: Yes, since he stopped smoking he is gaining weight and his blood sugars are going up Pt smoker: former smoker (1 ppd until August 2015 at which time he quit, started at age 56 yrs)  Pt meds include: Statin :No, states his cholesterol is good ASA: No Other anticoagulants/antiplatelets: Plavix     Past Medical History  Diagnosis Date  . Peripheral vascular disease   . Hypertension     Social History History  Substance Use Topics  . Smoking status: Former Smoker -- 1.50 packs/day for 50 years    Types: Cigarettes    Quit date: 11/09/2010  . Smokeless tobacco: Never Used     Comment: pt refused smoking info  . Alcohol Use: 3.0 oz/week    5 Cans of beer per week    Family History Family History  Problem Relation Age of Onset  . Diabetes Mother   . Hypertension Mother   . Heart attack Mother   . Alzheimer's disease Mother     Past Surgical History  Procedure Laterality Date  . Iliac artery stenting  11/09/2010    No Known Allergies  Current Outpatient Prescriptions  Medication Sig Dispense Refill  . clopidogrel (PLAVIX) 75 MG tablet TAKE ONE TABLET BY MOUTH EVERY DAY 30 tablet 11  . lisinopril-hydrochlorothiazide (PRINZIDE,ZESTORETIC) 20-12.5 MG per tablet Take 1 tablet by mouth daily.    . metFORMIN (GLUCOPHAGE) 500 MG tablet Take 500 mg by mouth. 2 tablets in morning, 2 tablets at lunch    . Omeprazole (PRILOSEC  PO) Take by mouth as needed.      No current facility-administered medications for this visit.    ROS: See HPI for pertinent positives and negatives.   Physical Examination  Filed Vitals:   07/13/14 0940  BP: 113/73  Pulse: 69  Resp: 16  Height:  (1.803 m)  Weight: 240 lb (108.863 kg)  SpO2: 97%   Body mass index is 33.49 kg/(m^2).   General: A&O x 3, WDWN. Gait: normal Eyes: PERRLA. Pulmonary: CTAB, without wheezes , rales or rhonchi. Cardiac: regular Rythm , without detected murmur.     Carotid Bruits Left Right   Negative Negative  Aorta is not palpable. Radial pulses: are 2+ right, 2+ left and palpable   VASCULAR EXAM: Extremities without ischemic changes  without Gangrene; without open wounds.     LE Pulses LEFT RIGHT   FEMORAL 1+ palpable 2+ palpable    POPLITEAL not palpable  not palpable   POSTERIOR TIBIAL not palpable  not palpable    DORSALIS PEDIS  ANTERIOR TIBIAL not palpable  2+ palpable    Abdomen: soft, NT, no masses. Skin: no rashes, no ulcers noted. Musculoskeletal: no muscle wasting or atrophy. Neurologic: A&O X 3; Appropriate Affect ; SENSATION: normal; MOTOR FUNCTION: moving all extremities equally, motor strength 5/5 throughout. Speech is fluent/normal. CN 2-12 intact.  Non-Invasive Vascular Imaging: DATE: 07/13/2014 ILIAC ARTERY STENT EVALUATION    Peripheral vascular disease     PREVIOUS INTERVENTION(S): Left iliac artery Percutaneous transluminal Angioplasty with stent 11/09/2010.        RIGHT  LEFT   Ratio (if abnormal) Waveform  Peak Systolic Velocity (cm/s) Ratio (if abnormal) Waveform    Aorta - Distal 64  T     Artery - Proximal to Stent 126  T    Stent - Proximal 105  T     Stent - Mid 129  T    Stent - Distal 146  T    Artery - Distal to Stent 119  T  0.95 Today's ABI / TBI 1.05  1.18 Previous ABI / TBI (11/17/2013  ) 1.22    Waveform:    M - Monophasic       B - Biphasic       T - Triphasic  If Ankle Brachial Index (ABI) or Toe Brachial Index (TBI) performed, please see complete report     ADDITIONAL FINDINGS:     Patent abdominal aorta, no hemodynamically significant plaque present. Patent left common iliac artery stent without evidence of hemodynamically significant disease.    Compared to the previous exam:  No significant change in comparison to the last exam on 11/17/2013.     ASSESSMENT: Mitzie NaSteven Schey is a 64 y.o. male who is s/p left common iliac artery stenting in June of 2012. He has no claudication symptoms, no tissue loss in his LE's. Today's left iliac artery stent Duplex reveals a patent left common iliac artery stent without evidence of hemodynamically significant disease, no significant change in comparison to the last exam on 11/17/2013. ABI's remain in the normal range. He stopped smoking about August 2015 and was congratulated; he has gained weight since stopping smoking and his home glucose reading reflect this. He is working closely with his DM medical provider to manage his DM. See Plan.   PLAN:  He was encouraged to incorporate as much walking as possible into his day, or regular exercise of some form at least 5 days/week. I discussed in depth with the patient the nature of atherosclerosis, and emphasized the importance of maximal medical management including strict control of blood pressure, blood glucose, and lipid levels, obtaining regular exercise, and continued cessation of smoking.  The patient is aware that without maximal medical management the underlying atherosclerotic disease process will progress, limiting the benefit of any interventions.  Based on the patient's vascular studies and examination, pt will return to  clinic in 1 year.   The patient was given information about PAD including signs, symptoms, treatment, what symptoms should prompt the patient to seek immediate medical care, and risk reduction measures to take.  Charisse MarchSuzanne Lawsyn Heiler, RN, MSN, FNP-C [facility] [place] Office Phone: [phone]  Clinic MD: Early  07/13/2014 9:37 AM

## 2014-07-13 NOTE — Addendum Note (Signed)
Addended by: Sharee PimpleMCCHESNEY, Jernard Reiber K on: 07/13/2014 12:03 PM   Modules accepted: Orders

## 2014-07-13 NOTE — Patient Instructions (Addendum)

## 2015-07-21 ENCOUNTER — Encounter: Payer: Self-pay | Admitting: Family

## 2015-07-25 ENCOUNTER — Encounter: Payer: Self-pay | Admitting: Family

## 2015-07-26 ENCOUNTER — Ambulatory Visit: Payer: 59 | Admitting: Family

## 2015-07-26 ENCOUNTER — Other Ambulatory Visit (HOSPITAL_COMMUNITY): Payer: 59

## 2015-07-26 ENCOUNTER — Encounter (HOSPITAL_COMMUNITY): Payer: 59

## 2015-07-27 ENCOUNTER — Encounter (HOSPITAL_COMMUNITY): Payer: 59

## 2015-07-27 ENCOUNTER — Ambulatory Visit: Payer: 59 | Admitting: Family

## 2015-08-12 ENCOUNTER — Other Ambulatory Visit: Payer: Self-pay | Admitting: Neurosurgery

## 2015-08-12 DIAGNOSIS — G8929 Other chronic pain: Secondary | ICD-10-CM

## 2015-08-12 DIAGNOSIS — M545 Low back pain: Principal | ICD-10-CM

## 2015-08-25 ENCOUNTER — Ambulatory Visit
Admission: RE | Admit: 2015-08-25 | Discharge: 2015-08-25 | Disposition: A | Discharge status: Home / Self Care | Payer: BLUE CROSS/BLUE SHIELD | Source: Ambulatory Visit | Attending: Neurosurgery | Admitting: Neurosurgery

## 2015-08-25 DIAGNOSIS — G8929 Other chronic pain: Secondary | ICD-10-CM

## 2015-08-25 DIAGNOSIS — M545 Low back pain: Principal | ICD-10-CM

## 2015-08-25 MED ORDER — METHYLPREDNISOLONE ACETATE 40 MG/ML INJ SUSP (RADIOLOG
120.0000 mg | Freq: Once | INTRAMUSCULAR | Status: AC
  Administered 2015-08-25: 120 mg via EPIDURAL

## 2015-08-25 MED ORDER — IOHEXOL 180 MG/ML  SOLN
1.0000 mL | Freq: Once | INTRAMUSCULAR | Status: AC | PRN
  Administered 2015-08-25: 1 mL via EPIDURAL

## 2015-08-25 NOTE — Discharge Instructions (Addendum)

## 2015-09-01 DIAGNOSIS — Z6833 Body mass index (BMI) 33.0-33.9, adult: Secondary | ICD-10-CM | POA: Diagnosis not present

## 2015-09-01 DIAGNOSIS — E1165 Type 2 diabetes mellitus with hyperglycemia: Secondary | ICD-10-CM | POA: Diagnosis not present

## 2015-09-01 DIAGNOSIS — I708 Atherosclerosis of other arteries: Secondary | ICD-10-CM | POA: Diagnosis not present

## 2015-09-01 DIAGNOSIS — I10 Essential (primary) hypertension: Secondary | ICD-10-CM | POA: Diagnosis not present

## 2015-09-01 DIAGNOSIS — Z0000 Encounter for general adult medical examination without abnormal findings: Secondary | ICD-10-CM | POA: Diagnosis not present

## 2016-06-21 ENCOUNTER — Telehealth: Payer: Self-pay

## 2016-06-21 NOTE — Telephone Encounter (Signed)
offered pt these appt dates 1/29 and 06/26/16 due to his complaints voiced to Triage RN, pt refused appts due to work schedule 06/21/16 bg

## 2016-06-21 NOTE — Telephone Encounter (Signed)
Phone call from pt.  Stated he called for a follow-up appt. And was given an appt. on 08/07/2016.   Is requesting to be seen sooner.  Reported 3 toes right foot (great toe, 2nd and 3rd toes) become numb with walking, and eases-up with rest.  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 unsure of any change in color of the toes when numbness occurs.  Denied any change in temperature of toes assoc. with the numbness.  Also c/o pain in left hip and buttocks with walking, which eases with rest.  Reported the (L) hip/ buttock pain has been occurring since approx. December.  Denied any rest pain.  Denied any nonhealing sores.  Reported he hasn't been able to keep previous f/u appt., due to lack of insurance.  Reported he now has insurance, and is eager to get an appt.  Also, reported episodes of dizziness that occurs with bending over.  Advised to report dizziness to his PCP.  Advised will try to move his f/u appt. to earlier time frame, and that a Scheduler will contact him re: this.  Verb. Understanding.

## 2016-06-25 ENCOUNTER — Encounter (HOSPITAL_COMMUNITY): Payer: BLUE CROSS/BLUE SHIELD

## 2016-06-26 ENCOUNTER — Encounter (HOSPITAL_COMMUNITY): Payer: BLUE CROSS/BLUE SHIELD

## 2016-06-26 ENCOUNTER — Ambulatory Visit: Payer: BLUE CROSS/BLUE SHIELD | Admitting: Family

## 2016-08-07 ENCOUNTER — Encounter (HOSPITAL_COMMUNITY): Payer: BLUE CROSS/BLUE SHIELD

## 2016-08-07 ENCOUNTER — Ambulatory Visit: Payer: Self-pay | Admitting: Family

## 2016-08-07 ENCOUNTER — Encounter (HOSPITAL_COMMUNITY): Payer: Self-pay

## 2016-09-06 ENCOUNTER — Encounter: Payer: Self-pay | Admitting: Family

## 2016-09-17 ENCOUNTER — Other Ambulatory Visit: Payer: Self-pay | Admitting: Vascular Surgery

## 2016-09-17 DIAGNOSIS — I739 Peripheral vascular disease, unspecified: Secondary | ICD-10-CM

## 2016-09-17 DIAGNOSIS — M7918 Myalgia, other site: Secondary | ICD-10-CM

## 2016-09-17 DIAGNOSIS — M25552 Pain in left hip: Secondary | ICD-10-CM

## 2016-09-17 DIAGNOSIS — I771 Stricture of artery: Secondary | ICD-10-CM

## 2016-09-18 ENCOUNTER — Ambulatory Visit (INDEPENDENT_AMBULATORY_CARE_PROVIDER_SITE_OTHER): Payer: 59 | Admitting: Family

## 2016-09-18 ENCOUNTER — Encounter: Payer: Self-pay | Admitting: Family

## 2016-09-18 ENCOUNTER — Ambulatory Visit (INDEPENDENT_AMBULATORY_CARE_PROVIDER_SITE_OTHER)
Admission: RE | Admit: 2016-09-18 | Discharge: 2016-09-18 | Disposition: A | Discharge status: Home / Self Care | Payer: 59 | Source: Ambulatory Visit | Attending: Family | Admitting: Family

## 2016-09-18 ENCOUNTER — Ambulatory Visit (HOSPITAL_COMMUNITY)
Admission: RE | Admit: 2016-09-18 | Discharge: 2016-09-18 | Disposition: A | Discharge status: Home / Self Care | Payer: 59 | Source: Ambulatory Visit | Attending: Family | Admitting: Family

## 2016-09-18 VITALS — BP 123/77 | HR 66 | Temp 97.2°F | Resp 20 | Ht 71.0 in | Wt 232.0 lb

## 2016-09-18 DIAGNOSIS — I771 Stricture of artery: Secondary | ICD-10-CM

## 2016-09-18 DIAGNOSIS — I739 Peripheral vascular disease, unspecified: Secondary | ICD-10-CM | POA: Diagnosis not present

## 2016-09-18 DIAGNOSIS — M25552 Pain in left hip: Secondary | ICD-10-CM | POA: Insufficient documentation

## 2016-09-18 DIAGNOSIS — Z95828 Presence of other vascular implants and grafts: Secondary | ICD-10-CM

## 2016-09-18 DIAGNOSIS — Z87891 Personal history of nicotine dependence: Secondary | ICD-10-CM | POA: Diagnosis not present

## 2016-09-18 DIAGNOSIS — M791 Myalgia: Secondary | ICD-10-CM

## 2016-09-18 DIAGNOSIS — M7918 Myalgia, other site: Secondary | ICD-10-CM

## 2016-09-18 DIAGNOSIS — I779 Disorder of arteries and arterioles, unspecified: Secondary | ICD-10-CM

## 2016-09-18 NOTE — Patient Instructions (Signed)

## 2016-09-18 NOTE — Progress Notes (Signed)
[facility] [place]   CC: Follow up peripheral artery occlusive disease  History of Present Illness Dennis Bradley is a 66 y.o. male patient of Dr. Arbie Cookey who is s/p left common iliac artery stenting in June of 2012. He returns today for follow up. He drives a truck for a living,. 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  Both of his great toe have intermittent tingling.  He denies non healing wounds. He sees Dr. Channing Mutters for known lumbar disc issues, has been having ESI's which helped; he denies any back related pain now.  He denies any history of stroke or TIA.  Pt states he had no insurance for a while and that is why it has been 2 years since he was here.    Pt Diabetic: Yes, since he stopped smoking he is gaining weight and his blood sugars are going up, states his last A1C was 9.0.  Pt smoker: former smoker (1 ppd until August 2015 at which time he quit, started at age 51 yrs)  Pt meds include: Statin :No, states his cholesterol is good ASA: No Other anticoagulants/antiplatelets: Plavix    Past Medical History:  Diagnosis Date  . Diabetes mellitus without complication (HCC)   . Hypertension   . Peripheral vascular disease [facility])     Social History Social History  Substance Use Topics  . Smoking status: Former Smoker    Packs/day: 1.50    Years: 50.00    Types: Cigarettes    Quit date: 11/09/2010  . Smokeless tobacco: Never Used     Comment: pt refused smoking info  . Alcohol use 3.0 oz/week    5 Cans of beer per week    Family History Family History  Problem Relation Age of Onset  . Diabetes Mother   . Hypertension Mother   . Heart attack Mother   . Alzheimer's disease Mother   . Cancer Father     Tongue    Past Surgical History:  Procedure Laterality Date  . iliac artery stenting  11/09/2010    No Known Allergies  Current Outpatient Prescriptions  Medication Sig Dispense Refill  .  clopidogrel (PLAVIX) 75 MG tablet TAKE ONE TABLET BY MOUTH EVERY DAY 30 tablet 11  . lisinopril-hydrochlorothiazide (PRINZIDE,ZESTORETIC) 20-12.5 MG per tablet Take 1 tablet by mouth daily.    . metFORMIN (GLUCOPHAGE) 500 MG tablet Take 500 mg by mouth. 2 tablets in morning, 2 tablets at lunch    . Omeprazole (PRILOSEC PO) Take by mouth as needed.     . TRULICITY 0.75 MG/0.5ML SOPN     . vitamin B-12 (CYANOCOBALAMIN) 1000 MCG tablet Take 1,000 mcg by mouth daily.     No current facility-administered medications for this visit.     ROS: See HPI for pertinent positives and negatives.   Physical Examination  Vitals:   09/18/16 0908  BP: 123/77  Pulse: 66  Resp: 20  Temp: 97.2 F (36.2 C)  TempSrc: Oral  SpO2: 95%  Weight: 232 lb (105.2 kg)  Height:  (1.803 m)   Body mass index is 32.36 kg/m.  General: A&O x 3, WDWN obese male. Gait: normal Eyes: PERRLA. Pulmonary: Respirations are non labored, CTAB, without wheezes, rales, or rhonchi. Cardiac: regular rhythm, no detected murmur.     Carotid Bruits Left Right   Negative Negative  Aorta is not palpable. Radial pulses: are 2+ right, 2+ left and palpable   VASCULAR EXAM: Extremities  without ischemic changes  without Gangrene; without open wounds.     LE Pulses LEFT RIGHT   FEMORAL 1+ palpable 2+ palpable    POPLITEAL not palpable  not palpable   POSTERIOR TIBIAL 2+ palpable  not palpable    DORSALIS PEDIS  ANTERIOR TIBIAL not palpable  2+ palpable    Abdomen: soft, NT, no palpable masses. Skin: no rashes, no ulcers noted. Musculoskeletal: no muscle wasting or atrophy. Neurologic: A&O X 3; Appropriate Affect ; SENSATION: normal; MOTOR FUNCTION: moving all extremities equally,  motor strength 5/5 throughout. Speech is fluent/normal. CN 2-12 intact.     ASSESSMENT: Dennis Bradley is a 66 y.o. male who is s/p left common iliac artery stenting in June of 2012. He has no claudication symptoms, no tissue loss in his LE's.  He stopped smoking about August 2015 and was congratulated; he has gained weight since stopping smoking and his home glucose reading reflect this. See Plan.   DATA Today's left iliac artery stent Duplex reveals a patent left common iliac artery stent without evidence of hemodynamically significant disease, no significant change in comparison to the last exam on 11/17/2013.  ABI: Right: 1.20 (0.95, 07-13-14), waveforms: biphasic; TBI: 1.02 Left: 1.11 (1.05, 07-13-14), waveforms: triphasic; TBI: 0.67 (was 0.89 on 07-13-14) Bilateral ABI's remain in the normal range, right has improved.    PLAN:  He was encouraged to incorporate as much walking as possible into his day, or regular exercise of some form at least 5 days/week.   I advised him to work closely with his DM medical provider to manage his DM.   Based on the patient's vascular studies and examination, pt will return to clinic in 1 year. I advised him and his wife to notify us if his left hip pain becomes worse with walking, if he develops other claudication symptoms, or if he develops a sore that has trouble healing in his feet or legs.   I discussed in depth with the patient the nature of atherosclerosis, and emphasized the importance of maximal medical management including strict control of blood pressure, blood glucose, and lipid levels, obtaining regular exercise, and continued cessation of smoking.  The patient is aware that without maximal medical management the underlying atherosclerotic disease process will progress, limiting the benefit of any interventions.  The patient was given information about PAD including signs, symptoms, treatment, what symptoms should prompt the patient to seek  immediate medical care, and risk reduction measures to take.  Charisse March, RN, MSN, FNP-C [facility] [place] Phone: [phone]  Clinic MD: Early  09/18/16 9:23 AM

## 2016-09-19 NOTE — Addendum Note (Signed)
Addended by: Burton Apley A on: 09/19/2016 09:28 AM   Modules accepted: Orders

## 2017-05-30 DIAGNOSIS — I10 Essential (primary) hypertension: Secondary | ICD-10-CM | POA: Diagnosis not present

## 2017-05-30 DIAGNOSIS — I708 Atherosclerosis of other arteries: Secondary | ICD-10-CM | POA: Diagnosis not present

## 2017-05-30 DIAGNOSIS — E1165 Type 2 diabetes mellitus with hyperglycemia: Secondary | ICD-10-CM | POA: Diagnosis not present

## 2017-05-30 DIAGNOSIS — Z6833 Body mass index (BMI) 33.0-33.9, adult: Secondary | ICD-10-CM | POA: Diagnosis not present

## 2017-07-18 ENCOUNTER — Encounter (HOSPITAL_COMMUNITY): Payer: Self-pay | Admitting: Emergency Medicine

## 2017-07-18 ENCOUNTER — Emergency Department (HOSPITAL_COMMUNITY)
Admission: EM | Admit: 2017-07-18 | Discharge: 2017-07-18 | Disposition: A | Discharge status: Home / Self Care | Payer: Worker's Compensation | Attending: Emergency Medicine | Admitting: Emergency Medicine

## 2017-07-18 ENCOUNTER — Other Ambulatory Visit: Payer: Self-pay

## 2017-07-18 ENCOUNTER — Emergency Department (HOSPITAL_COMMUNITY): Payer: Worker's Compensation

## 2017-07-18 DIAGNOSIS — S80922A Unspecified superficial injury of left lower leg, initial encounter: Secondary | ICD-10-CM | POA: Diagnosis present

## 2017-07-18 DIAGNOSIS — Y990 Civilian activity done for income or pay: Secondary | ICD-10-CM | POA: Insufficient documentation

## 2017-07-18 DIAGNOSIS — Y9389 Activity, other specified: Secondary | ICD-10-CM | POA: Insufficient documentation

## 2017-07-18 DIAGNOSIS — Z7902 Long term (current) use of antithrombotics/antiplatelets: Secondary | ICD-10-CM | POA: Diagnosis not present

## 2017-07-18 DIAGNOSIS — Y9289 Other specified places as the place of occurrence of the external cause: Secondary | ICD-10-CM | POA: Diagnosis not present

## 2017-07-18 DIAGNOSIS — Z79899 Other long term (current) drug therapy: Secondary | ICD-10-CM | POA: Insufficient documentation

## 2017-07-18 DIAGNOSIS — E119 Type 2 diabetes mellitus without complications: Secondary | ICD-10-CM | POA: Diagnosis not present

## 2017-07-18 DIAGNOSIS — S93402A Sprain of unspecified ligament of left ankle, initial encounter: Secondary | ICD-10-CM | POA: Diagnosis not present

## 2017-07-18 DIAGNOSIS — I10 Essential (primary) hypertension: Secondary | ICD-10-CM | POA: Diagnosis not present

## 2017-07-18 DIAGNOSIS — Z7984 Long term (current) use of oral hypoglycemic drugs: Secondary | ICD-10-CM | POA: Insufficient documentation

## 2017-07-18 DIAGNOSIS — Z87891 Personal history of nicotine dependence: Secondary | ICD-10-CM | POA: Diagnosis not present

## 2017-07-18 DIAGNOSIS — W1789XA Other fall from one level to another, initial encounter: Secondary | ICD-10-CM | POA: Insufficient documentation

## 2017-07-18 MED ORDER — IBUPROFEN 400 MG PO TABS
400.0000 mg | ORAL_TABLET | Freq: Once | ORAL | Status: AC
  Administered 2017-07-18: 400 mg via ORAL
  Filled 2017-07-18: qty 1

## 2017-07-18 MED ORDER — NAPROXEN 500 MG PO TABS: 500.0000 mg | ORAL_TABLET | Freq: Two times a day (BID) | ORAL | 0 refills | Status: AC

## 2017-07-18 NOTE — ED Triage Notes (Signed)
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 Pt states he landed on his left leg and slipped and feels like "I hyperextend my knee."

## 2017-07-18 NOTE — ED Provider Notes (Signed)
[facility] EMERGENCY DEPARTMENT Provider Note   CSN: [id number] Arrival date & time: 07/18/17  0424     History   Chief Complaint Chief Complaint  Patient presents with  . Fall    HPI Dennis Bradley is a 67 y.o. male.  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 States he thinks he hyperextended his left knee.  Complains of pain and swelling to left knee that radiates up entire leg when he tries to walk.  Denies hitting head or losing consciousness.  Does have scrape to his left arm.  No neck or back pain.  He is able to bear weight with difficulty.  No focal weakness, numbness or tingling.  Patient does take Plavix.  Patient estimates he fell about 2-3 feet and his left knee went outward and his left ankle went inward.     The history is provided by the patient.  Fall  Pertinent negatives include no chest pain, no abdominal pain, no headaches and no shortness of breath.    Past Medical History:  Diagnosis Date  . Diabetes mellitus without complication (HCC)   . Hypertension   . Peripheral vascular disease [facility](HCC)     Patient Active Problem List   Diagnosis Date Noted  . Paresthesia of left foot 11/17/2013  . Aftercare following surgery of the circulatory system, NEC 11/17/2013  . Peripheral vascular disease, unspecified (HCC) 06/02/2013  . 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 iliac artery stenting  11/09/2010       Home Medications    Prior to Admission medications   Medication Sig Start Date End Date Taking? Authorizing Provider  clopidogrel (PLAVIX) 75 MG tablet TAKE ONE TABLET BY MOUTH EVERY DAY 11/09/11  Yes Fransisco Hertzhen, Brian L, MD  lisinopril-hydrochlorothiazide (PRINZIDE,ZESTORETIC) 20-12.5 MG per tablet Take 1 tablet by mouth daily.   Yes [provider]  metFORMIN (GLUCOPHAGE) 500 MG tablet Take 500 mg by mouth. 2 tablets in morning, 2  tablets at lunch   Yes [provider]  Omeprazole (PRILOSEC PO) Take by mouth as needed.     [provider]  TRULICITY 0.75 MG/0.5ML SOPN  09/01/16   [provider]  vitamin B-12 (CYANOCOBALAMIN) 1000 MCG tablet Take 1,000 mcg by mouth daily.    [provider]    Family History Family History  Problem Relation Age of Onset  . Diabetes Mother   . Hypertension Mother   . Heart attack Mother   . Alzheimer's disease Mother   . Cancer Father        Tongue    Social History Social History   Tobacco Use  . Smoking status: Former Smoker    Packs/day: 1.50    Years: 50.00    Pack years: 75.00    Types: Cigarettes    Last attempt to quit: 11/09/2010    Years since quitting: 6.6  . Smokeless tobacco: Never Used  . Tobacco comment: pt refused smoking info  Substance Use Topics  . Alcohol use: Yes    Alcohol/week: 3.0 oz    Types: 5 Cans of beer per week  . Drug use: No     Allergies   Patient has no known allergies.   Review of Systems Review of Systems  Constitutional: Negative for activity change, appetite change and fever.  HENT: Negative for congestion and rhinorrhea.   Respiratory: Negative for cough,  chest tightness and shortness of breath.   Cardiovascular: Negative for chest pain.  Gastrointestinal: Negative for abdominal pain, nausea and vomiting.  Genitourinary: Negative for dysuria, hematuria and testicular pain.  Musculoskeletal: Positive for arthralgias and myalgias. Negative for back pain and neck pain.  Skin: Negative for rash.  Neurological: Negative for dizziness, weakness, light-headedness and headaches.   all other systems are negative except as noted in the HPI and PMH.     Physical Exam Updated Vital Signs BP (!) 127/49   Pulse 68   Temp (!) 97.4 F (36.3 C) (Oral)   Resp 16   Wt 110.7 kg (244 lb)   SpO2 96%   BMI 34.03 kg/m   Physical Exam  Constitutional: He is oriented to person, place, and time. He  appears well-developed and well-nourished. No distress.  HENT:  Head: Normocephalic and atraumatic.  Mouth/Throat: Oropharynx is clear and moist. No oropharyngeal exudate.  Eyes: Conjunctivae and EOM are normal. Pupils are equal, round, and reactive to light.  Neck: Normal range of motion. Neck supple.  No C spine tenderness  Cardiovascular: Normal rate, regular rhythm, normal heart sounds and intact distal pulses.  No murmur heard. Pulmonary/Chest: Effort normal and breath sounds normal. No respiratory distress.  Abdominal: Soft. There is no tenderness. There is no rebound and no guarding.  Musculoskeletal: Normal range of motion. He exhibits no edema or tenderness.  Swelling to left lateral malleolus.  Intact DP and PT pulses.  No pain at base of fifth metatarsal.  No pain along the Achilles.  Ankle flexion and extension intact.  Tenderness to palpation of left lateral knee.  Flexion extension intact.  Able to lift leg and keep knee extended.  no appreciable ligament laxity  Abrasion L arm without bony tenderness  Neurological: He is alert and oriented to person, place, and time. No cranial nerve deficit. He exhibits normal muscle tone. Coordination normal.  No ataxia on finger to nose bilaterally. No pronator drift. 5/5 strength throughout. CN 2-12 intact.Equal grip strength. Sensation intact.   Skin: Skin is warm.  Psychiatric: He has a normal mood and affect. His behavior is normal.  Nursing note and vitals reviewed.    ED Treatments / Results  Labs (all labs ordered are listed, but only abnormal results are displayed) Labs Reviewed - No data to display  EKG  EKG Interpretation None       Radiology Dg Ankle Complete Left  Result Date: 07/18/2017 CLINICAL DATA:  Larey Seat yesterday, hyperextension injury. EXAM: LEFT ANKLE COMPLETE - 3+ VIEW COMPARISON:  None. FINDINGS: No fracture deformity nor dislocation. Chronic deformity medial malleolus. Talar dome subchondral cyst. The  ankle mortise appears congruent and the tibiofibular syndesmosis intact. No destructive bony lesions. Mild lateral ankle soft tissue swelling. Moderate vascular calcifications. Pretibial phleboliths. IMPRESSION: Mild lateral ankle soft tissue swelling without acute fracture deformity or dislocation. Electronically Signed   By: Awilda Metro M.D.   On: 07/18/2017 05:19   Dg Knee Complete 4 Views Left  Result Date: 07/18/2017 CLINICAL DATA:  Larey Seat, hyperextended knee yesterday. EXAM: LEFT KNEE - COMPLETE 4+ VIEW COMPARISON:  None. FINDINGS: No acute fracture deformity or dislocation. Mild tibial spine peaking. No advanced degenerative change for age. No destructive bony lesions. Moderate to severe vascular calcifications. IMPRESSION: No fracture deformity or dislocation. Electronically Signed   By: Awilda Metro M.D.   On: 07/18/2017 05:20    Procedures Procedures (including critical care time)  Medications Ordered in ED Medications  ibuprofen (ADVIL,MOTRIN) tablet 400  mg (not administered)     Initial Impression / Assessment and Plan / ED Course  I have reviewed the triage vital signs and the nursing notes.  Pertinent labs & imaging results that were available during my care of the patient were reviewed by me and considered in my medical decision making (see chart for details).    Patient with fall twisting his left leg having left posterior knee pain and left lateral ankle pain.  Denies hitting head or losing consciousness.  No head, neck, back, chest or abdominal pain.  Neurovascularly intact.  X-rays negative for fractures or dislocations.  Patient given ASO brace and knee sleeve.  Instructed on rice therapy, elevation, anti-inflammatories, orthopedic follow-up.  Crutches given.  Return precautions discussed. Discussed with patient he may be to have an MRI of his knee if symptoms persist.  Will refer to Dr. Romeo Apple. Final Clinical Impressions(s) / ED Diagnoses   Final  diagnoses:  Sprain of left ankle, unspecified ligament, initial encounter    ED Discharge Orders    None       Violeta Lecount, Jeannett Senior, MD 07/18/17 [phone]

## 2017-07-18 NOTE — Discharge Instructions (Signed)
Follow up with Dr. Romeo AppleHarrison. Keep the leg elevated and apply ice as we discussed. Return to the ED if you develop new or worsening symptoms.

## 2017-07-18 NOTE — ED Notes (Signed)
Pt ambulatory with crutches to waiting room. Pt verbalized understanding of discharge instructions.   

## 2017-09-24 ENCOUNTER — Ambulatory Visit: Payer: BLUE CROSS/BLUE SHIELD | Admitting: Family

## 2017-09-24 ENCOUNTER — Telehealth: Payer: Self-pay | Admitting: Family

## 2017-09-24 ENCOUNTER — Ambulatory Visit (INDEPENDENT_AMBULATORY_CARE_PROVIDER_SITE_OTHER)
Admission: RE | Admit: 2017-09-24 | Discharge: 2017-09-24 | Disposition: A | Discharge status: Home / Self Care | Payer: BLUE CROSS/BLUE SHIELD | Source: Ambulatory Visit | Attending: Family | Admitting: Family

## 2017-09-24 ENCOUNTER — Ambulatory Visit (HOSPITAL_COMMUNITY)
Admission: RE | Admit: 2017-09-24 | Discharge: 2017-09-24 | Disposition: A | Discharge status: Home / Self Care | Payer: BLUE CROSS/BLUE SHIELD | Source: Ambulatory Visit | Attending: Family | Admitting: Family

## 2017-09-24 DIAGNOSIS — I779 Disorder of arteries and arterioles, unspecified: Secondary | ICD-10-CM | POA: Insufficient documentation

## 2017-09-24 DIAGNOSIS — I771 Stricture of artery: Secondary | ICD-10-CM | POA: Insufficient documentation

## 2017-09-24 DIAGNOSIS — Z95828 Presence of other vascular implants and grafts: Secondary | ICD-10-CM | POA: Diagnosis not present

## 2017-09-24 NOTE — Telephone Encounter (Signed)
Spoke with pt, discussed him working closely with his PCP to get his DM in as good control as possible, since that is his primary atherosclerotic risk factor, he quit smoking in 2015.  Return in 1 year for left aortoiliac stent duplex and ABI's.

## 2017-10-02 DIAGNOSIS — M545 Low back pain: Secondary | ICD-10-CM | POA: Diagnosis not present

## 2017-10-02 DIAGNOSIS — E1165 Type 2 diabetes mellitus with hyperglycemia: Secondary | ICD-10-CM | POA: Diagnosis not present

## 2017-10-02 DIAGNOSIS — Z720 Tobacco use: Secondary | ICD-10-CM | POA: Diagnosis not present

## 2017-10-02 DIAGNOSIS — I10 Essential (primary) hypertension: Secondary | ICD-10-CM | POA: Diagnosis not present

## 2017-10-08 DIAGNOSIS — E1165 Type 2 diabetes mellitus with hyperglycemia: Secondary | ICD-10-CM | POA: Diagnosis not present

## 2017-10-08 DIAGNOSIS — Z1331 Encounter for screening for depression: Secondary | ICD-10-CM | POA: Diagnosis not present

## 2017-10-08 DIAGNOSIS — I10 Essential (primary) hypertension: Secondary | ICD-10-CM | POA: Diagnosis not present

## 2017-10-08 DIAGNOSIS — Z6833 Body mass index (BMI) 33.0-33.9, adult: Secondary | ICD-10-CM | POA: Diagnosis not present

## 2017-10-08 DIAGNOSIS — I708 Atherosclerosis of other arteries: Secondary | ICD-10-CM | POA: Diagnosis not present

## 2017-10-08 DIAGNOSIS — Z1389 Encounter for screening for other disorder: Secondary | ICD-10-CM | POA: Diagnosis not present

## 2017-10-08 DIAGNOSIS — L570 Actinic keratosis: Secondary | ICD-10-CM | POA: Diagnosis not present

## 2018-09-04 DIAGNOSIS — E1165 Type 2 diabetes mellitus with hyperglycemia: Secondary | ICD-10-CM | POA: Diagnosis not present

## 2018-09-04 DIAGNOSIS — I10 Essential (primary) hypertension: Secondary | ICD-10-CM | POA: Diagnosis not present

## 2018-10-24 DIAGNOSIS — J209 Acute bronchitis, unspecified: Secondary | ICD-10-CM | POA: Diagnosis not present

## 2018-11-18 DIAGNOSIS — I10 Essential (primary) hypertension: Secondary | ICD-10-CM | POA: Diagnosis not present

## 2018-11-18 DIAGNOSIS — M545 Low back pain: Secondary | ICD-10-CM | POA: Diagnosis not present

## 2018-11-18 DIAGNOSIS — E1165 Type 2 diabetes mellitus with hyperglycemia: Secondary | ICD-10-CM | POA: Diagnosis not present

## 2018-11-18 DIAGNOSIS — Z720 Tobacco use: Secondary | ICD-10-CM | POA: Diagnosis not present

## 2018-12-03 DIAGNOSIS — E1165 Type 2 diabetes mellitus with hyperglycemia: Secondary | ICD-10-CM | POA: Diagnosis not present

## 2018-12-03 DIAGNOSIS — Z1389 Encounter for screening for other disorder: Secondary | ICD-10-CM | POA: Diagnosis not present

## 2018-12-03 DIAGNOSIS — L989 Disorder of the skin and subcutaneous tissue, unspecified: Secondary | ICD-10-CM | POA: Diagnosis not present

## 2018-12-03 DIAGNOSIS — I10 Essential (primary) hypertension: Secondary | ICD-10-CM | POA: Diagnosis not present

## 2018-12-03 DIAGNOSIS — I708 Atherosclerosis of other arteries: Secondary | ICD-10-CM | POA: Diagnosis not present

## 2018-12-16 IMAGING — DX DG KNEE COMPLETE 4+V*L*
4 series · 4 of 4 positions shown · non-contrast
Comparison: None.

CLINICAL DATA: Fell, hyperextended knee yesterday.

EXAM:
LEFT KNEE - COMPLETE 4+ VIEW

[knee ap]
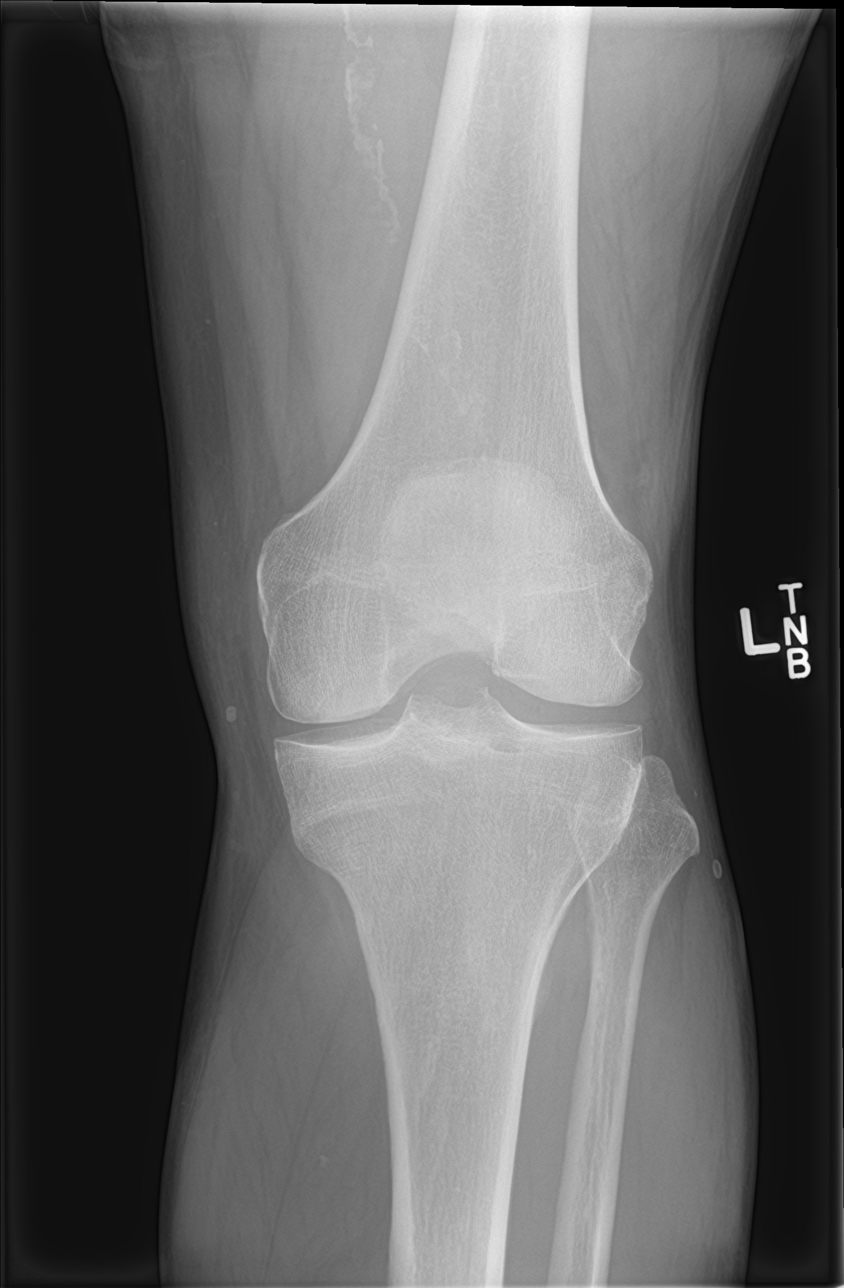

[tunnel]
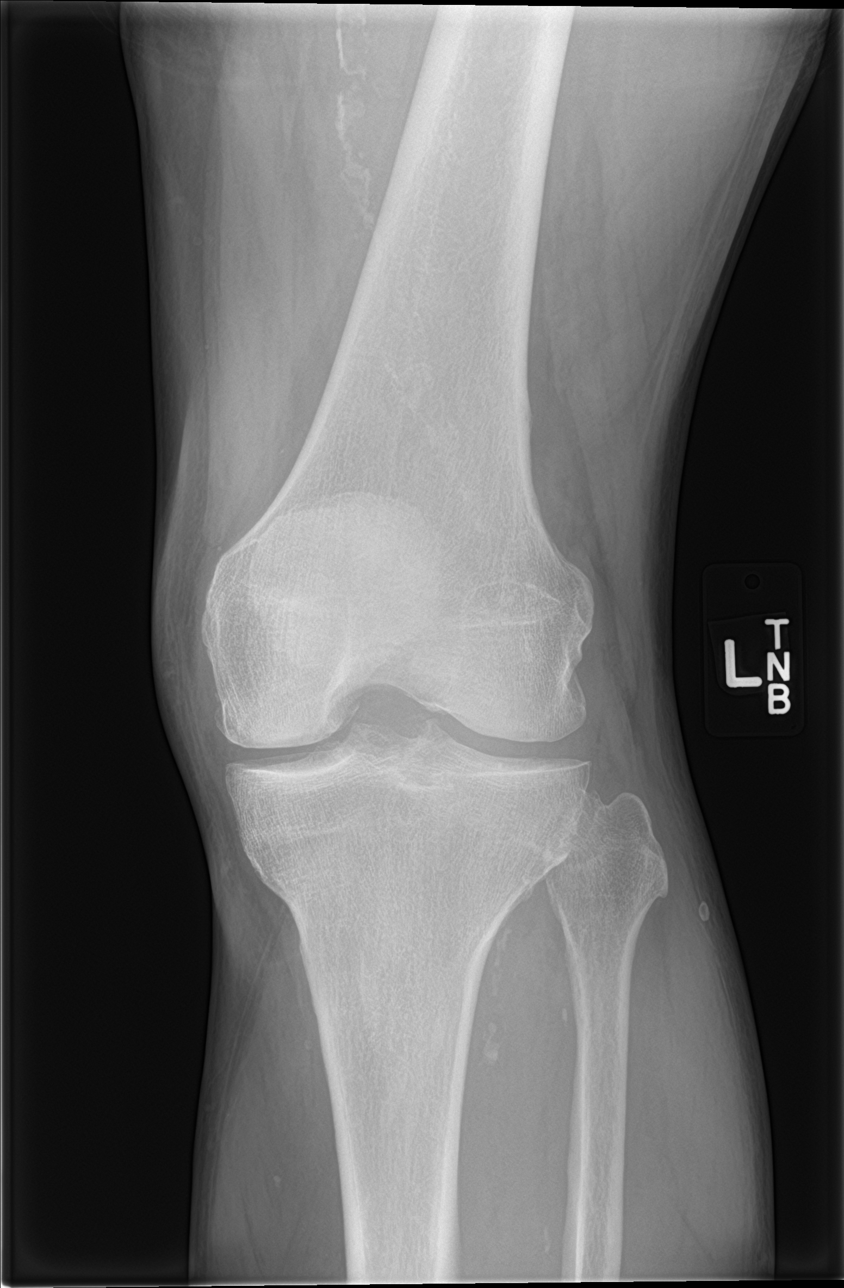

[knee lat]
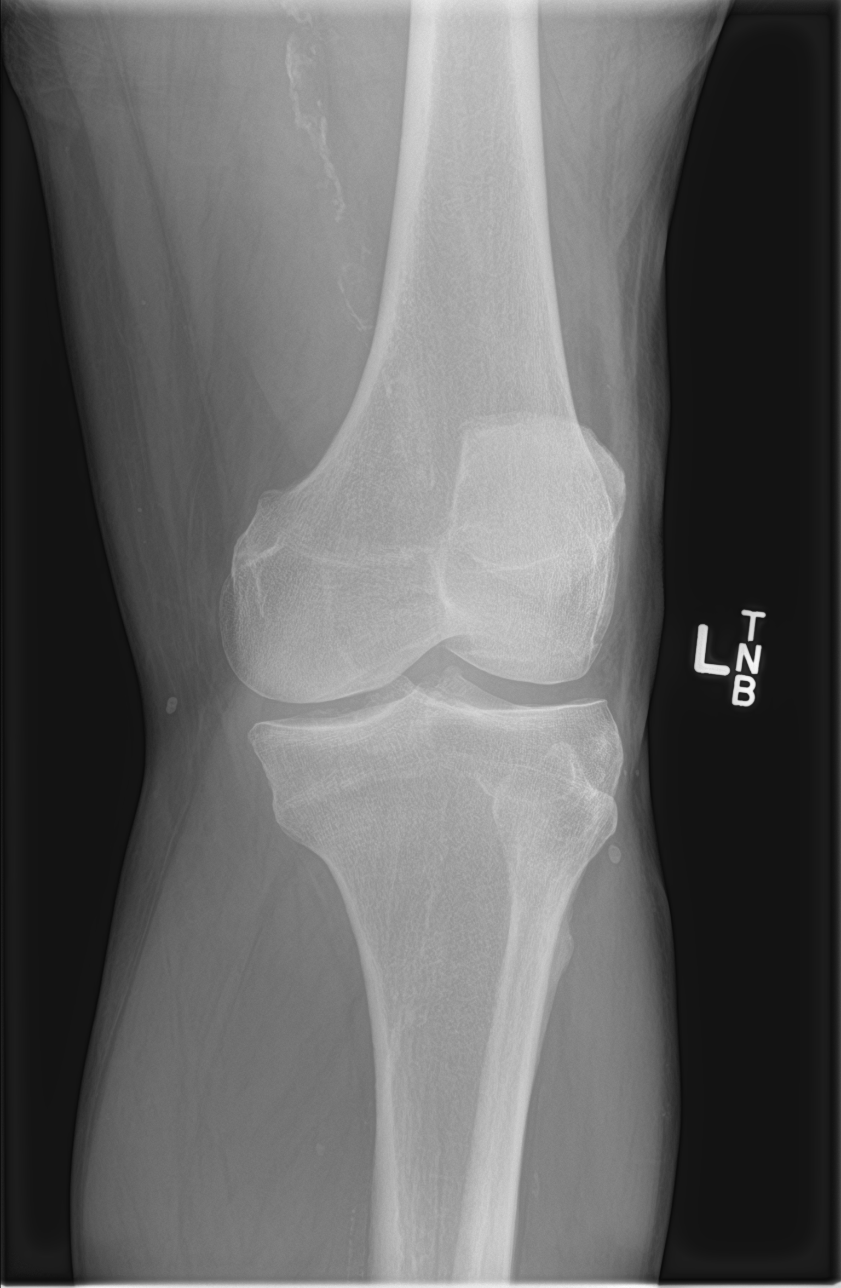

[knee sunrise]
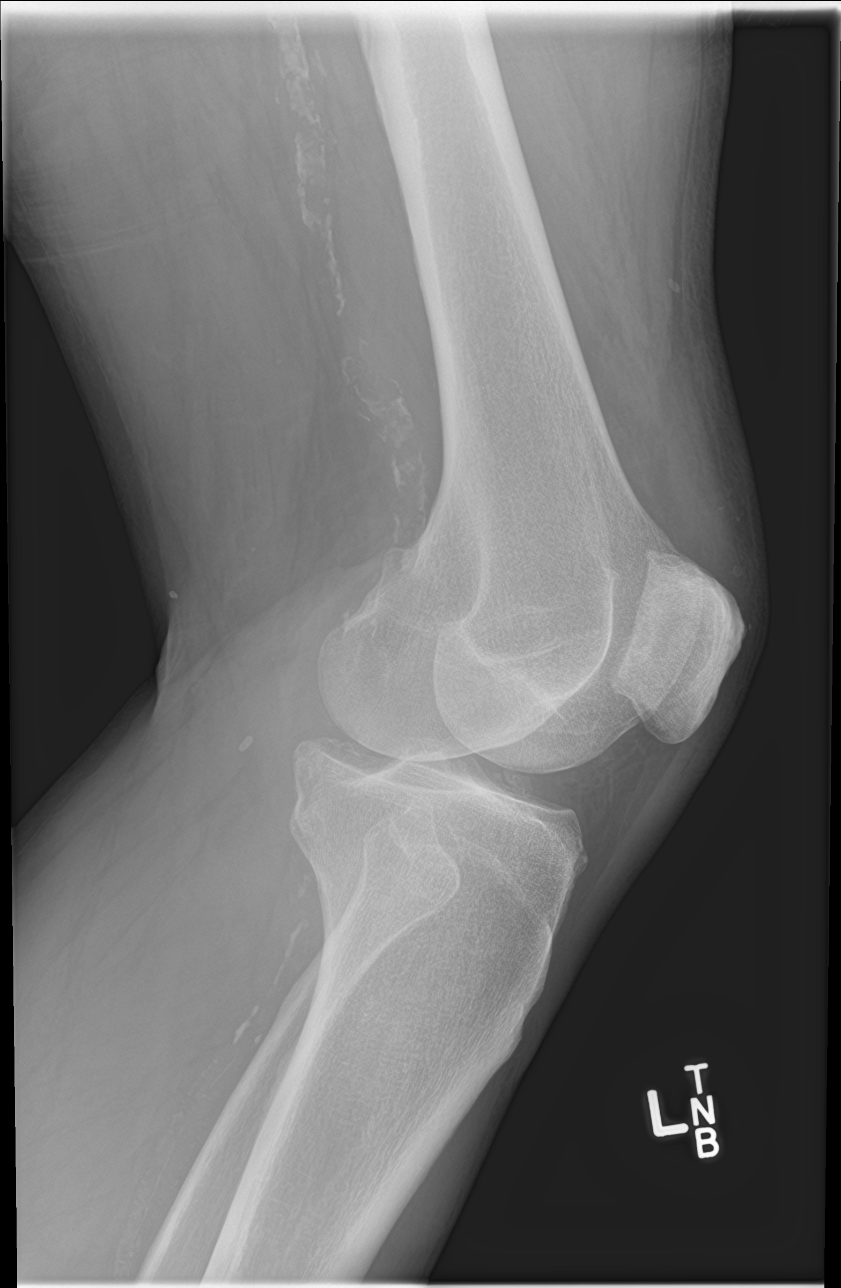

[4 of 4 positions shown; findings below may reference images not displayed]

FINDINGS: No acute fracture deformity or dislocation. Mild tibial spine
peaking. No advanced degenerative change for age. No destructive
bony lesions. Moderate to severe vascular calcifications.
IMPRESSION: No fracture deformity or dislocation.

## 2018-12-26 DIAGNOSIS — L989 Disorder of the skin and subcutaneous tissue, unspecified: Secondary | ICD-10-CM | POA: Diagnosis not present

## 2018-12-26 DIAGNOSIS — B078 Other viral warts: Secondary | ICD-10-CM | POA: Diagnosis not present

## 2019-01-08 DIAGNOSIS — L600 Ingrowing nail: Secondary | ICD-10-CM | POA: Diagnosis not present

## 2019-01-08 DIAGNOSIS — L03032 Cellulitis of left toe: Secondary | ICD-10-CM | POA: Diagnosis not present

## 2019-01-19 DIAGNOSIS — R21 Rash and other nonspecific skin eruption: Secondary | ICD-10-CM | POA: Diagnosis not present

## 2019-01-19 DIAGNOSIS — Z6832 Body mass index (BMI) 32.0-32.9, adult: Secondary | ICD-10-CM | POA: Diagnosis not present

## 2019-02-03 DIAGNOSIS — L600 Ingrowing nail: Secondary | ICD-10-CM | POA: Diagnosis not present

## 2019-02-03 DIAGNOSIS — M79675 Pain in left toe(s): Secondary | ICD-10-CM | POA: Diagnosis not present

## 2019-03-17 DIAGNOSIS — Z4889 Encounter for other specified surgical aftercare: Secondary | ICD-10-CM | POA: Diagnosis not present

## 2019-03-20 DIAGNOSIS — Z720 Tobacco use: Secondary | ICD-10-CM | POA: Diagnosis not present

## 2019-03-20 DIAGNOSIS — E1165 Type 2 diabetes mellitus with hyperglycemia: Secondary | ICD-10-CM | POA: Diagnosis not present

## 2019-03-20 DIAGNOSIS — I10 Essential (primary) hypertension: Secondary | ICD-10-CM | POA: Diagnosis not present

## 2019-03-30 DIAGNOSIS — E1165 Type 2 diabetes mellitus with hyperglycemia: Secondary | ICD-10-CM | POA: Diagnosis not present

## 2019-03-30 DIAGNOSIS — I708 Atherosclerosis of other arteries: Secondary | ICD-10-CM | POA: Diagnosis not present

## 2019-03-30 DIAGNOSIS — N401 Enlarged prostate with lower urinary tract symptoms: Secondary | ICD-10-CM | POA: Diagnosis not present

## 2019-03-30 DIAGNOSIS — I10 Essential (primary) hypertension: Secondary | ICD-10-CM | POA: Diagnosis not present

## 2019-05-26 DIAGNOSIS — R350 Frequency of micturition: Secondary | ICD-10-CM | POA: Diagnosis not present

## 2019-05-26 DIAGNOSIS — N401 Enlarged prostate with lower urinary tract symptoms: Secondary | ICD-10-CM | POA: Diagnosis not present

## 2019-11-13 DIAGNOSIS — I10 Essential (primary) hypertension: Secondary | ICD-10-CM | POA: Diagnosis not present

## 2019-11-13 DIAGNOSIS — E1165 Type 2 diabetes mellitus with hyperglycemia: Secondary | ICD-10-CM | POA: Diagnosis not present

## 2019-11-13 DIAGNOSIS — Z720 Tobacco use: Secondary | ICD-10-CM | POA: Diagnosis not present

## 2019-11-19 DIAGNOSIS — N401 Enlarged prostate with lower urinary tract symptoms: Secondary | ICD-10-CM | POA: Diagnosis not present

## 2019-11-19 DIAGNOSIS — I708 Atherosclerosis of other arteries: Secondary | ICD-10-CM | POA: Diagnosis not present

## 2019-11-19 DIAGNOSIS — E1165 Type 2 diabetes mellitus with hyperglycemia: Secondary | ICD-10-CM | POA: Diagnosis not present

## 2019-11-19 DIAGNOSIS — I10 Essential (primary) hypertension: Secondary | ICD-10-CM | POA: Diagnosis not present

## 2019-12-01 DIAGNOSIS — M542 Cervicalgia: Secondary | ICD-10-CM | POA: Diagnosis not present

## 2019-12-01 DIAGNOSIS — R197 Diarrhea, unspecified: Secondary | ICD-10-CM | POA: Diagnosis not present

## 2019-12-01 DIAGNOSIS — M5412 Radiculopathy, cervical region: Secondary | ICD-10-CM | POA: Diagnosis not present

## 2019-12-01 DIAGNOSIS — M79601 Pain in right arm: Secondary | ICD-10-CM | POA: Diagnosis not present

## 2019-12-01 DIAGNOSIS — M4722 Other spondylosis with radiculopathy, cervical region: Secondary | ICD-10-CM | POA: Diagnosis not present

## 2019-12-01 DIAGNOSIS — I10 Essential (primary) hypertension: Secondary | ICD-10-CM | POA: Diagnosis not present

## 2019-12-01 DIAGNOSIS — E119 Type 2 diabetes mellitus without complications: Secondary | ICD-10-CM | POA: Diagnosis not present

## 2019-12-01 DIAGNOSIS — Z87891 Personal history of nicotine dependence: Secondary | ICD-10-CM | POA: Diagnosis not present

## 2019-12-03 DIAGNOSIS — M5410 Radiculopathy, site unspecified: Secondary | ICD-10-CM | POA: Diagnosis not present

## 2019-12-03 DIAGNOSIS — M5090 Cervical disc disorder, unspecified, unspecified cervical region: Secondary | ICD-10-CM | POA: Diagnosis not present

## 2021-01-05 ENCOUNTER — Other Ambulatory Visit: Payer: Self-pay

## 2021-01-05 DIAGNOSIS — I771 Stricture of artery: Secondary | ICD-10-CM

## 2021-01-13 ENCOUNTER — Other Ambulatory Visit (HOSPITAL_COMMUNITY): Payer: Self-pay | Admitting: Surgery

## 2021-01-13 DIAGNOSIS — I739 Peripheral vascular disease, unspecified: Secondary | ICD-10-CM

## 2021-01-16 ENCOUNTER — Ambulatory Visit (HOSPITAL_COMMUNITY)
Admission: RE | Admit: 2021-01-16 | Discharge: 2021-01-16 | Disposition: A | Discharge status: Home / Self Care | Payer: 59 | Source: Ambulatory Visit | Attending: Surgery | Admitting: Surgery

## 2021-01-16 ENCOUNTER — Ambulatory Visit (INDEPENDENT_AMBULATORY_CARE_PROVIDER_SITE_OTHER): Payer: 59 | Admitting: Physician Assistant

## 2021-01-16 ENCOUNTER — Ambulatory Visit (INDEPENDENT_AMBULATORY_CARE_PROVIDER_SITE_OTHER)
Admission: RE | Admit: 2021-01-16 | Discharge: 2021-01-16 | Disposition: A | Discharge status: Home / Self Care | Payer: 59 | Source: Ambulatory Visit

## 2021-01-16 VITALS — BP 165/81 | HR 78 | Temp 97.9°F | Resp 16 | Ht 71.0 in | Wt 228.0 lb

## 2021-01-16 DIAGNOSIS — I739 Peripheral vascular disease, unspecified: Secondary | ICD-10-CM | POA: Insufficient documentation

## 2021-01-16 DIAGNOSIS — I771 Stricture of artery: Secondary | ICD-10-CM | POA: Insufficient documentation

## 2021-01-16 NOTE — Progress Notes (Signed)
Peripheral Arterial Disease Follow-Up   VASCULAR SURGERY ASSESSMENT & PLAN:   Dennis Bradley is a 70 y.o. male patient of Dr. Arbie Cookey who is s/p left common iliac artery stenting in June of 2012. Stable peripheral arterial disease.  No significant change in right ABIs as compared to 3 years ago.  Left ABI non-compressible today. Palpable pedal pulses. His stent is patent.  Right plantar foot pain with weight bearing. Nothing to suggest this is arterial in nature. Discuss with PCP and monitor. May need referral to ortho/podiatry. Continue optimal medical management of diabetes, hypertension and follow-up with primary care physician. Encouraged continuation of complete smoking cessation. Continue the following medications: Plavix. Discuss statin therapy with Dr. Vernice Jefferson. Follow-up in one year with aorto-iliac duplex and ABIs  SUBJECTIVE:   He complains of approximately 5-6 months of burning pain of the plantar aspect of his right foot when walking. Stops with rest. The patient denies rest pain.  Denies skin loss or ulceration.  PHYSICAL EXAM:   There were no vitals filed for this visit.  General appearance: Well-developed, well-nourished in no apparent distress Neurologic: Alert and oriented x 4. Cardiovascular: Heart rate and rhythm are regular.  No carotid bruits. Abdomen: No palpable pulsatile mass. Extremities: Skin intact.  Both feet are warm and well perfused.  Motor function and sensation intact Pulse exam: 2+ right dorsalis pedis, 2+ left posterior tibial pulses    NON-INVASIVE VASCULAR STUDIES   Patent left CIA stent; no evidence of stenosis. Right ABI/TBI: 1.0/0.64 triphasic Left ABI/TBI: [place] triphasic   PROBLEM LIST:    The patient's past medical history, past surgical history, family history, social history, allergy list and medication list are reviewed.   CURRENT MEDS:    Current Outpatient Medications:    clopidogrel (PLAVIX) 75 MG tablet, TAKE ONE TABLET BY  MOUTH EVERY DAY, Disp: 30 tablet, Rfl: 11   lisinopril-hydrochlorothiazide (PRINZIDE,ZESTORETIC) 20-12.5 MG per tablet, Take 1 tablet by mouth daily., Disp: , Rfl:    metFORMIN (GLUCOPHAGE) 500 MG tablet, Take 500 mg by mouth. 2 tablets in morning, 2 tablets at lunch, Disp: , Rfl:    naproxen (NAPROSYN) 500 MG tablet, Take 1 tablet (500 mg total) by mouth 2 (two) times daily., Disp: 30 tablet, Rfl: 0   Omeprazole (PRILOSEC PO), Take by mouth as needed. , Disp: , Rfl:    TRULICITY 0.75 MG/0.5ML SOPN, , Disp: , Rfl:    vitamin B-12 (CYANOCOBALAMIN) 1000 MCG tablet, Take 1,000 mcg by mouth daily., Disp: , Rfl:    REVIEW OF SYSTEMS:   [X]  denotes positive finding, [ ]  denotes negative finding Cardiac  Comments:  Chest pain or chest pressure:    Shortness of breath upon exertion:    Short of breath when lying flat:    Irregular heart rhythm:        Vascular    Pain in calf, thigh, or hip brought on by ambulation:    Pain in feet at night that wakes you up from your sleep:     Blood clot in your veins:    Leg swelling:         Pulmonary    Oxygen at home:    Productive cough:     Wheezing:         Neurologic    Sudden weakness in arms or legs:     Sudden numbness in arms or legs:     Sudden onset of difficulty speaking or slurred speech:    Temporary loss of vision  in one eye:     Problems with dizziness:         Gastrointestinal    Blood in stool:     Vomited blood:         Genitourinary    Burning when urinating:     Blood in urine:        Psychiatric    Major depression:         Hematologic    Bleeding problems:    Problems with blood clotting too easily:        Skin    Rashes or ulcers:        Constitutional    Fever or chills:     Milinda Antis, PA-C  Office: [phone] 01/16/2021 Clinic MD: Dr. Myra Gianotti

## 2021-01-17 ENCOUNTER — Other Ambulatory Visit: Payer: Self-pay

## 2021-01-19 ENCOUNTER — Encounter (HOSPITAL_COMMUNITY): Payer: BLUE CROSS/BLUE SHIELD

## 2022-05-04 ENCOUNTER — Telehealth (HOSPITAL_COMMUNITY): Payer: Self-pay

## 2022-05-04 NOTE — Telephone Encounter (Signed)
Call was made to schedule recall, during call patient stated that they would be moving to [place] within the month. As such patient requested cancel follow up and inform provider.
